# Patient Record
Sex: Female | Born: 1985 | Race: White | Hispanic: No | Marital: Married
Health system: Southern US, Community
[De-identification: ages and names within clinical notes are randomized; demographics above are authoritative.]

## PROBLEM LIST (undated history)

## (undated) DIAGNOSIS — Z8619 Personal history of other infectious and parasitic diseases: Secondary | ICD-10-CM

## (undated) DIAGNOSIS — Y991 Military activity: Secondary | ICD-10-CM

## (undated) DIAGNOSIS — F419 Anxiety disorder, unspecified: Secondary | ICD-10-CM

## (undated) DIAGNOSIS — E349 Endocrine disorder, unspecified: Secondary | ICD-10-CM

## (undated) DIAGNOSIS — R454 Irritability and anger: Secondary | ICD-10-CM

## (undated) DIAGNOSIS — M25519 Pain in unspecified shoulder: Secondary | ICD-10-CM

## (undated) DIAGNOSIS — R519 Headache, unspecified: Secondary | ICD-10-CM

## (undated) DIAGNOSIS — Z331 Pregnant state, incidental: Secondary | ICD-10-CM

## (undated) DIAGNOSIS — M940 Chondrocostal junction syndrome [Tietze]: Secondary | ICD-10-CM

## (undated) DIAGNOSIS — R0781 Pleurodynia: Secondary | ICD-10-CM

## (undated) DIAGNOSIS — F329 Major depressive disorder, single episode, unspecified: Secondary | ICD-10-CM

## (undated) DIAGNOSIS — R87629 Unspecified abnormal cytological findings in specimens from vagina: Secondary | ICD-10-CM

## (undated) DIAGNOSIS — F3181 Bipolar II disorder: Secondary | ICD-10-CM

## (undated) DIAGNOSIS — F4310 Post-traumatic stress disorder, unspecified: Secondary | ICD-10-CM

## (undated) DIAGNOSIS — R000 Tachycardia, unspecified: Secondary | ICD-10-CM

## (undated) DIAGNOSIS — G4700 Insomnia, unspecified: Secondary | ICD-10-CM

## (undated) DIAGNOSIS — K589 Irritable bowel syndrome without diarrhea: Secondary | ICD-10-CM

## (undated) DIAGNOSIS — R635 Abnormal weight gain: Secondary | ICD-10-CM

## (undated) DIAGNOSIS — L700 Acne vulgaris: Secondary | ICD-10-CM

## (undated) DIAGNOSIS — D225 Melanocytic nevi of trunk: Secondary | ICD-10-CM

## (undated) DIAGNOSIS — N930 Postcoital and contact bleeding: Secondary | ICD-10-CM

## (undated) DIAGNOSIS — R61 Generalized hyperhidrosis: Secondary | ICD-10-CM

## (undated) DIAGNOSIS — T07XXXA Unspecified multiple injuries, initial encounter: Secondary | ICD-10-CM

## (undated) DIAGNOSIS — R791 Abnormal coagulation profile: Secondary | ICD-10-CM

## (undated) DIAGNOSIS — E559 Vitamin D deficiency, unspecified: Secondary | ICD-10-CM

## (undated) DIAGNOSIS — R4586 Emotional lability: Secondary | ICD-10-CM

## (undated) DIAGNOSIS — J309 Allergic rhinitis, unspecified: Secondary | ICD-10-CM

## (undated) DIAGNOSIS — F1920 Other psychoactive substance dependence, uncomplicated: Secondary | ICD-10-CM

## (undated) DIAGNOSIS — F32A Depression, unspecified: Secondary | ICD-10-CM

## (undated) DIAGNOSIS — R002 Palpitations: Secondary | ICD-10-CM

## (undated) HISTORY — DX: Abnormal weight gain: R63.5

## (undated) HISTORY — DX: Personal history of other infectious and parasitic diseases: Z86.19

## (undated) HISTORY — DX: Unspecified multiple injuries, initial encounter: T07.XXXA

## (undated) HISTORY — DX: Postcoital and contact bleeding: N93.0

## (undated) HISTORY — DX: Irritability and anger: R45.4

## (undated) HISTORY — DX: Unspecified abnormal cytological findings in specimens from vagina: R87.629

## (undated) HISTORY — DX: Pleurodynia: R07.81

## (undated) HISTORY — DX: Tachycardia, unspecified: R00.0

## (undated) HISTORY — DX: Military activity: Y99.1

## (undated) HISTORY — DX: Irritable bowel syndrome, unspecified: K58.9

## (undated) HISTORY — DX: Depression, unspecified: F32.A

## (undated) HISTORY — DX: Vitamin D deficiency, unspecified: E55.9

## (undated) HISTORY — DX: Insomnia, unspecified: G47.00

## (undated) HISTORY — PX: DILATION AND CURETTAGE OF UTERUS: SHX78

## (undated) HISTORY — DX: Endocrine disorder, unspecified: E34.9

## (undated) HISTORY — DX: Emotional lability: R45.86

## (undated) HISTORY — DX: Other psychoactive substance dependence, uncomplicated: F19.20

## (undated) HISTORY — DX: Pain in unspecified shoulder: M25.519

## (undated) HISTORY — DX: Anxiety disorder, unspecified: F41.9

## (undated) HISTORY — DX: Major depressive disorder, single episode, unspecified: F32.9

## (undated) HISTORY — DX: Palpitations: R00.2

## (undated) HISTORY — DX: Chondrocostal junction syndrome (tietze): M94.0

## (undated) HISTORY — DX: Headache, unspecified: R51.9

## (undated) HISTORY — DX: Allergic rhinitis, unspecified: J30.9

## (undated) HISTORY — DX: Post-traumatic stress disorder, unspecified: F43.10

## (undated) HISTORY — DX: Abnormal coagulation profile: R79.1

## (undated) HISTORY — DX: Bipolar II disorder: F31.81

## (undated) HISTORY — DX: Acne vulgaris: L70.0

## (undated) HISTORY — PX: COLPOSCOPY: SHX161

## (undated) HISTORY — DX: Generalized hyperhidrosis: R61

## (undated) HISTORY — DX: Pregnant state, incidental: Z33.1

## (undated) HISTORY — DX: Melanocytic nevi of trunk: D22.5

---

## 2016-07-07 DIAGNOSIS — Z8619 Personal history of other infectious and parasitic diseases: Secondary | ICD-10-CM

## 2016-07-07 HISTORY — DX: Personal history of other infectious and parasitic diseases: Z86.19

## 2016-07-07 NOTE — L&D Delivery Note (Signed)
Delivery Note Pt progressed quickl yto complete dilation. At 10:06 PM a healthy female was delivered via  (Presentation: OA  ).  APGAR: 9, 9; weight pending .   Placenta status: delivered spontaneously .  Cord:  with the following complications: none .    Anesthesia: epidural   Episiotomy:  none Lacerations:  none Suture Repair:n/a Est. Blood Loss (mL):  175mL  Mom to postpartum.  Baby to Couplet care / Skin to Skin.  Lori Wright 02/06/2017, 10:25 PM

## 2016-07-09 LAB — OB RESULTS CONSOLE GC/CHLAMYDIA
Chlamydia: NEGATIVE
Gonorrhea: NEGATIVE

## 2016-07-09 LAB — OB RESULTS CONSOLE ABO/RH: RH TYPE: POSITIVE

## 2016-07-09 LAB — OB RESULTS CONSOLE RPR: RPR: NONREACTIVE

## 2016-07-09 LAB — OB RESULTS CONSOLE ANTIBODY SCREEN: ANTIBODY SCREEN: NEGATIVE

## 2016-07-09 LAB — OB RESULTS CONSOLE RUBELLA ANTIBODY, IGM: Rubella: NON-IMMUNE/NOT IMMUNE

## 2016-07-09 LAB — OB RESULTS CONSOLE HIV ANTIBODY (ROUTINE TESTING): HIV: NONREACTIVE

## 2016-07-09 LAB — OB RESULTS CONSOLE HEPATITIS B SURFACE ANTIGEN: Hepatitis B Surface Ag: NEGATIVE

## 2017-01-09 LAB — OB RESULTS CONSOLE GBS: STREP GROUP B AG: NEGATIVE

## 2017-01-26 ENCOUNTER — Encounter (HOSPITAL_COMMUNITY): Payer: Self-pay | Admitting: *Deleted

## 2017-01-26 ENCOUNTER — Telehealth (HOSPITAL_COMMUNITY): Payer: Self-pay | Admitting: *Deleted

## 2017-01-26 NOTE — Telephone Encounter (Signed)
Preadmission screen  

## 2017-01-27 ENCOUNTER — Encounter (HOSPITAL_COMMUNITY): Payer: Self-pay | Admitting: *Deleted
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
## 2017-02-06 ENCOUNTER — Inpatient Hospital Stay (HOSPITAL_COMMUNITY): Payer: Non-veteran care | Admitting: Anesthesiology

## 2017-02-06 ENCOUNTER — Encounter (HOSPITAL_COMMUNITY): Payer: Self-pay

## 2017-02-06 ENCOUNTER — Inpatient Hospital Stay (HOSPITAL_COMMUNITY)
Admission: RE | Admit: 2017-02-06 | Discharge: 2017-02-08 | DRG: 775 | Disposition: A | Discharge status: Home / Self Care | Payer: Non-veteran care | Source: Ambulatory Visit | Attending: Obstetrics and Gynecology | Admitting: Obstetrics and Gynecology

## 2017-02-06 DIAGNOSIS — Z3A40 40 weeks gestation of pregnancy: Secondary | ICD-10-CM

## 2017-02-06 DIAGNOSIS — O9962 Diseases of the digestive system complicating childbirth: Principal | ICD-10-CM | POA: Diagnosis present

## 2017-02-06 DIAGNOSIS — Z87891 Personal history of nicotine dependence: Secondary | ICD-10-CM

## 2017-02-06 DIAGNOSIS — O26893 Other specified pregnancy related conditions, third trimester: Secondary | ICD-10-CM | POA: Diagnosis present

## 2017-02-06 DIAGNOSIS — K219 Gastro-esophageal reflux disease without esophagitis: Secondary | ICD-10-CM | POA: Diagnosis present

## 2017-02-06 DIAGNOSIS — Z3490 Encounter for supervision of normal pregnancy, unspecified, unspecified trimester: Secondary | ICD-10-CM

## 2017-02-06 LAB — CBC
HCT: 37.4 % (ref 36.0–46.0)
Hemoglobin: 12.8 g/dL (ref 12.0–15.0)
MCH: 27.8 pg (ref 26.0–34.0)
MCHC: 34.2 g/dL (ref 30.0–36.0)
MCV: 81.3 fL (ref 78.0–100.0)
Platelets: 254 K/uL (ref 150–400)
RBC: 4.6 MIL/uL (ref 3.87–5.11)
RDW: 15.2 % (ref 11.5–15.5)
WBC: 11.3 K/uL — ABNORMAL HIGH (ref 4.0–10.5)

## 2017-02-06 LAB — TYPE AND SCREEN
ABO/RH(D): O POS
Antibody Screen: NEGATIVE

## 2017-02-06 LAB — ABO/RH: ABO/RH(D): O POS

## 2017-02-06 MED ORDER — PHENYLEPHRINE 40 MCG/ML (10ML) SYRINGE FOR IV PUSH (FOR BLOOD PRESSURE SUPPORT)
80.0000 ug | PREFILLED_SYRINGE | INTRAVENOUS | Status: DC | PRN
  Filled 2017-02-06: qty 5
  Filled 2017-02-06: qty 10

## 2017-02-06 MED ORDER — FENTANYL 2.5 MCG/ML BUPIVACAINE 1/10 % EPIDURAL INFUSION (WH - ANES)
14.0000 mL/h | INTRAMUSCULAR | Status: DC | PRN
  Administered 2017-02-06: 10 mL/h via EPIDURAL
  Filled 2017-02-06: qty 100

## 2017-02-06 MED ORDER — LACTATED RINGERS IV SOLN: 500.0000 mL | INTRAVENOUS | Status: DC | PRN

## 2017-02-06 MED ORDER — TERBUTALINE SULFATE 1 MG/ML IJ SOLN
0.2500 mg | Freq: Once | INTRAMUSCULAR | Status: DC | PRN
Start: 2017-02-06 — End: 2017-02-08
  Filled 2017-02-06: qty 1

## 2017-02-06 MED ORDER — DIPHENHYDRAMINE HCL 50 MG/ML IJ SOLN: 12.5000 mg | INTRAMUSCULAR | Status: DC | PRN

## 2017-02-06 MED ORDER — PHENYLEPHRINE 40 MCG/ML (10ML) SYRINGE FOR IV PUSH (FOR BLOOD PRESSURE SUPPORT): 80.0000 ug | PREFILLED_SYRINGE | INTRAVENOUS | Status: DC | PRN

## 2017-02-06 MED ORDER — ONDANSETRON HCL 4 MG/2ML IJ SOLN
4.0000 mg | Freq: Four times a day (QID) | INTRAMUSCULAR | Status: DC | PRN
  Administered 2017-02-06: 4 mg via INTRAVENOUS
  Filled 2017-02-06: qty 2

## 2017-02-06 MED ORDER — LIDOCAINE HCL (PF) 1 % IJ SOLN
INTRAMUSCULAR | Status: DC | PRN
  Administered 2017-02-06 (×2): 5 mL via EPIDURAL

## 2017-02-06 MED ORDER — OXYTOCIN 40 UNITS IN LACTATED RINGERS INFUSION - SIMPLE MED
1.0000 m[IU]/min | INTRAVENOUS | Status: DC
  Administered 2017-02-06: 2 m[IU]/min via INTRAVENOUS
  Filled 2017-02-06: qty 1000

## 2017-02-06 MED ORDER — OXYTOCIN BOLUS FROM INFUSION
500.0000 mL | Freq: Once | INTRAVENOUS | Status: AC
  Administered 2017-02-06: 500 mL via INTRAVENOUS

## 2017-02-06 MED ORDER — LACTATED RINGERS IV SOLN
INTRAVENOUS | Status: DC
  Administered 2017-02-06 (×2): via INTRAVENOUS

## 2017-02-06 MED ORDER — EPHEDRINE 5 MG/ML INJ
10.0000 mg | INTRAVENOUS | Status: DC | PRN
  Filled 2017-02-06: qty 2

## 2017-02-06 MED ORDER — EPHEDRINE 5 MG/ML INJ: 10.0000 mg | INTRAVENOUS | Status: DC | PRN

## 2017-02-06 MED ORDER — SOD CITRATE-CITRIC ACID 500-334 MG/5ML PO SOLN: 30.0000 mL | ORAL | Status: DC | PRN

## 2017-02-06 MED ORDER — LIDOCAINE HCL (PF) 1 % IJ SOLN
30.0000 mL | INTRAMUSCULAR | Status: DC | PRN
  Filled 2017-02-06: qty 30

## 2017-02-06 MED ORDER — PHENYLEPHRINE 40 MCG/ML (10ML) SYRINGE FOR IV PUSH (FOR BLOOD PRESSURE SUPPORT)
80.0000 ug | PREFILLED_SYRINGE | INTRAVENOUS | Status: DC | PRN
  Filled 2017-02-06: qty 5

## 2017-02-06 MED ORDER — LACTATED RINGERS IV SOLN
500.0000 mL | Freq: Once | INTRAVENOUS | Status: AC
  Administered 2017-02-06: 500 mL via INTRAVENOUS

## 2017-02-06 MED ORDER — OXYTOCIN 40 UNITS IN LACTATED RINGERS INFUSION - SIMPLE MED: 2.5000 [IU]/h | INTRAVENOUS | Status: DC

## 2017-02-06 MED ORDER — ACETAMINOPHEN 325 MG PO TABS: 650.0000 mg | ORAL_TABLET | ORAL | Status: DC | PRN

## 2017-02-06 MED ORDER — BUTORPHANOL TARTRATE 1 MG/ML IJ SOLN
1.0000 mg | INTRAMUSCULAR | Status: DC | PRN
Start: 2017-02-06 — End: 2017-02-08
  Administered 2017-02-06: 1 mg via INTRAVENOUS
  Filled 2017-02-06: qty 1

## 2017-02-06 MED ORDER — OXYCODONE-ACETAMINOPHEN 5-325 MG PO TABS: 2.0000 | ORAL_TABLET | ORAL | Status: DC | PRN

## 2017-02-06 MED ORDER — OXYCODONE-ACETAMINOPHEN 5-325 MG PO TABS
1.0000 | ORAL_TABLET | ORAL | Status: DC | PRN
Start: 2017-02-06 — End: 2017-02-08

## 2017-02-06 MED ORDER — LACTATED RINGERS IV SOLN
500.0000 mL | Freq: Once | INTRAVENOUS | Status: DC
Start: 2017-02-06 — End: 2017-02-06

## 2017-02-06 NOTE — Anesthesia Preprocedure Evaluation (Addendum)
Anesthesia Evaluation  Patient identified by MRN, date of birth, ID band Patient awake    Reviewed: Allergy & Precautions, NPO status , Patient's Chart, lab work & pertinent test results  History of Anesthesia Complications Negative for: history of anesthetic complications  Airway Mallampati: I  TM Distance: >3 FB Neck ROM: Full    Dental  (+) Dental Advisory Given   Pulmonary former smoker,    breath sounds clear to auscultation       Cardiovascular negative cardio ROS   Rhythm:Regular Rate:Normal     Neuro/Psych PSYCHIATRIC DISORDERS (PTSD)    GI/Hepatic Neg liver ROS, GERD  ,  Endo/Other  negative endocrine ROS  Renal/GU negative Renal ROS     Musculoskeletal   Abdominal   Peds  Hematology plt 254k   Anesthesia Other Findings   Reproductive/Obstetrics (+) Pregnancy                            Anesthesia Physical Anesthesia Plan  ASA: II  Anesthesia Plan: Epidural   Post-op Pain Management:    Induction:   PONV Risk Score and Plan: Treatment may vary due to age or medical condition  Airway Management Planned: Natural Airway  Additional Equipment:   Intra-op Plan:   Post-operative Plan:   Informed Consent: I have reviewed the patients History and Physical, chart, labs and discussed the procedure including the risks, benefits and alternatives for the proposed anesthesia with the patient or authorized representative who has indicated his/her understanding and acceptance.   Dental advisory given  Plan Discussed with:   Anesthesia Plan Comments: (Patient identified. Risks/Benefits/Options discussed with patient including but not limited to bleeding, infection, nerve damage, paralysis, failed block, incomplete pain control, headache, blood pressure changes, nausea, vomiting, reactions to medication both or allergic, itching and postpartum back pain. Confirmed with bedside nurse  the patient's most recent platelet count. Confirmed with patient that they are not currently taking any anticoagulation, have any bleeding history or any family history of bleeding disorders. Patient expressed understanding and wished to proceed. All questions were answered. )       Anesthesia Quick Evaluation

## 2017-02-06 NOTE — Anesthesia Procedure Notes (Addendum)
Epidural Patient location during procedure: OB Start time: 02/06/2017 7:00 PM End time: 02/06/2017 7:27 PM  Staffing Anesthesiologist: Jairo BenJACKSON, Herndon Grill Performed: anesthesiologist   Preanesthetic Checklist Completed: patient identified, surgical consent, pre-op evaluation, timeout performed, IV checked, risks and benefits discussed and monitors and equipment checked  Epidural Patient position: sitting Prep: site prepped and draped and DuraPrep Patient monitoring: blood pressure, continuous pulse ox and heart rate Approach: midline Location: L2-L3 Injection technique: LOR air  Needle:  Needle type: Tuohy  Needle gauge: 17 G Needle length: 9 cm Needle insertion depth: 3.5 cm Catheter type: closed end flexible Catheter at skin depth: 9.5 cm Test dose: negative (1% lidocaine)  Assessment Events: blood not aspirated, injection not painful, no injection resistance, negative IV test and no paresthesia  Additional Notes Pt identified in Labor room.  Monitors applied. Working IV access confirmed. Sterile prep, drape lumbar spine.  1% lido local L 2,3.  #17ga Touhy LOR air at 3.5 cm L 2,3, cath in easily to 9.5 cm skin. Test dose OK, cath dosed and infusion begun.  Patient asymptomatic, VSS, no heme aspirated, tolerated well.  Sandford Craze Starlyn Droge, MDReason for block:procedure for pain

## 2017-02-06 NOTE — Progress Notes (Signed)
Patient ID: Lori Wright, female   DOB: 1985-12-11, 31 y.o.   MRN: [id number] Pt comfortable with epidural afeb vss  FHR category 1  Cervix 6/80/-1  IUPC placed to adjust pitocin Making progress

## 2017-02-06 NOTE — Anesthesia Pain Management Evaluation Note (Signed)
  CRNA Pain Management Visit Note  Patient: Lori Wright, 31 y.o., female  "Hello I am a [facility] of the anesthesia [facility]. We have an anesthesia team available at all times to provide care throughout the hospital, including epidural management and anesthesia for C-section. I don't know your plan for the delivery whether it a natural birth, water birth, IV sedation, nitrous supplementation, doula or epidural, but we want to meet your pain goals."   1.Was your pain managed to your expectations on prior hospitalizations?   Yes   2.What is your expectation for pain management during this hospitalization?     Epidural and IV pain meds  3.How can we help you reach that goal? IV pain meds. Epidural if needed. Patient is aware of all pain control options.  Record the patient's initial score and the patient's pain goal.   Pain: 0  Pain Goal: 10 [facility] wants you to be able to say your pain was always managed very well.  Keitra Carusone L 02/06/2017

## 2017-02-07 ENCOUNTER — Inpatient Hospital Stay (HOSPITAL_COMMUNITY): Admission: AD | Admit: 2017-02-07 | Payer: Self-pay | Source: Ambulatory Visit | Admitting: Obstetrics and Gynecology

## 2017-02-07 ENCOUNTER — Encounter (HOSPITAL_COMMUNITY): Payer: Self-pay

## 2017-02-07 LAB — CBC
HCT: 32.8 % — ABNORMAL LOW (ref 36.0–46.0)
Hemoglobin: 11.1 g/dL — ABNORMAL LOW (ref 12.0–15.0)
MCH: 27.8 pg (ref 26.0–34.0)
MCHC: 33.8 g/dL (ref 30.0–36.0)
MCV: 82.2 fL (ref 78.0–100.0)
Platelets: 182 K/uL (ref 150–400)
RBC: 3.99 MIL/uL (ref 3.87–5.11)
RDW: 15.4 % (ref 11.5–15.5)
WBC: 14.6 K/uL — ABNORMAL HIGH (ref 4.0–10.5)

## 2017-02-07 LAB — RPR: RPR Ser Ql: NONREACTIVE

## 2017-02-07 MED ORDER — IBUPROFEN 600 MG PO TABS
600.0000 mg | ORAL_TABLET | Freq: Four times a day (QID) | ORAL | Status: DC
  Administered 2017-02-07 – 2017-02-08 (×4): 600 mg via ORAL
  Filled 2017-02-07 (×4): qty 1

## 2017-02-07 MED ORDER — WITCH HAZEL-GLYCERIN EX PADS: 1.0000 "application " | MEDICATED_PAD | CUTANEOUS | Status: DC | PRN

## 2017-02-07 MED ORDER — SENNOSIDES-DOCUSATE SODIUM 8.6-50 MG PO TABS: 2.0000 | ORAL_TABLET | ORAL | Status: DC

## 2017-02-07 MED ORDER — OXYCODONE HCL 5 MG PO TABS: 5.0000 mg | ORAL_TABLET | ORAL | Status: DC | PRN

## 2017-02-07 MED ORDER — ZOLPIDEM TARTRATE 5 MG PO TABS: 5.0000 mg | ORAL_TABLET | Freq: Every evening | ORAL | Status: DC | PRN

## 2017-02-07 MED ORDER — TETANUS-DIPHTH-ACELL PERTUSSIS 5-2.5-18.5 LF-MCG/0.5 IM SUSP: 0.5000 mL | Freq: Once | INTRAMUSCULAR | Status: DC

## 2017-02-07 MED ORDER — DIBUCAINE 1 % RE OINT: 1.0000 "application " | TOPICAL_OINTMENT | RECTAL | Status: DC | PRN

## 2017-02-07 MED ORDER — BENZOCAINE-MENTHOL 20-0.5 % EX AERO: 1.0000 "application " | INHALATION_SPRAY | CUTANEOUS | Status: DC | PRN

## 2017-02-07 MED ORDER — ONDANSETRON HCL 4 MG/2ML IJ SOLN: 4.0000 mg | INTRAMUSCULAR | Status: DC | PRN

## 2017-02-07 MED ORDER — DIPHENHYDRAMINE HCL 25 MG PO CAPS: 25.0000 mg | ORAL_CAPSULE | Freq: Four times a day (QID) | ORAL | Status: DC | PRN

## 2017-02-07 MED ORDER — COCONUT OIL OIL: 1.0000 "application " | TOPICAL_OIL | Status: DC | PRN

## 2017-02-07 MED ORDER — OXYCODONE HCL 5 MG PO TABS: 10.0000 mg | ORAL_TABLET | ORAL | Status: DC | PRN

## 2017-02-07 MED ORDER — ACETAMINOPHEN 325 MG PO TABS: 650.0000 mg | ORAL_TABLET | ORAL | Status: DC | PRN

## 2017-02-07 MED ORDER — SIMETHICONE 80 MG PO CHEW: 80.0000 mg | CHEWABLE_TABLET | ORAL | Status: DC | PRN

## 2017-02-07 MED ORDER — ONDANSETRON HCL 4 MG PO TABS: 4.0000 mg | ORAL_TABLET | ORAL | Status: DC | PRN

## 2017-02-07 MED ORDER — PRENATAL MULTIVITAMIN CH
1.0000 | ORAL_TABLET | Freq: Every day | ORAL | Status: DC
  Administered 2017-02-07: 1 via ORAL
  Filled 2017-02-07: qty 1

## 2017-02-07 NOTE — Anesthesia Postprocedure Evaluation (Signed)
Anesthesia Post Note  Patient: Lori Wright  Procedure(s) Performed: * No procedures listed *     Patient location during evaluation: Mother Baby Anesthesia Type: Epidural Level of consciousness: awake and alert and oriented Pain management: satisfactory to patient Vital Signs Assessment: post-procedure vital signs reviewed and stable Respiratory status: spontaneous breathing and nonlabored ventilation Cardiovascular status: stable Postop Assessment: no headache, no backache, no signs of nausea or vomiting, adequate PO intake and patient able to bend at knees (patient up walking) Anesthetic complications: no    Last Vitals:  Vitals:   02/07/17 0135 02/07/17 0535  BP: 117/65 (!) 100/57  Pulse: 67 (!) 57  Resp: 18 18  Temp: 36.7 C 36.6 C    Last Pain:  Vitals:   02/07/17 0649  TempSrc:   PainSc: 0-No pain   Pain Goal:                 Madison HickmanGREGORY,Maevyn Riordan

## 2017-02-07 NOTE — Lactation Note (Signed)
This note was copied from a baby's chart. Lactation Consultation Note; Mom attempting to latch baby as I went into room. Assisted with getting deeper latch. Mom reports no pain with sucks, only tugging. Reviewed BF basics.  Dr Eddie Candleummings in to check baby. Spit small amount of mucous. Ember latched to other breast after a few attempts, then getting sleepy.Encouraged skin to skin as much as possible today. Reviewed norman newborn behavior the first 24 hours and cluster feeding, and encouraged to take nap this afternoon. BF brochure given with reviewed of our phone number to call with questions/concerns. No further questions at present. Patient Name: Lori Wright Today's Date: 02/07/2017 Reason for consult: Initial assessment   Maternal Data Formula Feeding for Exclusion: No Has patient been taught Hand Expression?: Yes Does the patient have breastfeeding experience prior to this delivery?: No  Feeding Feeding Type: Breast Fed Length of feed: 10 min  LATCH Score Latch: Repeated attempts needed to sustain latch, nipple held in mouth throughout feeding, stimulation needed to elicit sucking reflex.  Audible Swallowing: A few with stimulation  Type of Nipple: Everted at rest and after stimulation  Comfort (Breast/Nipple): Soft / non-tender  Hold (Positioning): Assistance needed to correctly position infant at breast and maintain latch.  LATCH Score: 7  Interventions Interventions: Breast feeding basics reviewed;Assisted with latch;Skin to skin;Hand express  Lactation Tools Discussed/Used     Consult Status Consult Status: Follow-up Date: 02/08/17 Follow-up type: In-patient    Pamelia HoitWeeks, Demetries Coia D 02/07/2017, 9:36 AM

## 2017-02-07 NOTE — Progress Notes (Signed)
CSW acknowledges consult for MOB's hx of PTSD. This writer met with MOB at bedside to assess needs. Upon this writers arrival, MOB was breast feeding baby with significant other/FOB present. With MOB's permission, this writer explained role and reasoning for visit. MOB was warm and welcoming. CSW inquired about PTSD hx. 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 MOB notes she receives care at the [facility] where she has a psychiatrist and has an appointment scheduled for next week. CSW discussed PPD with MOB and baby blues. CSW encouraged MOB to seek medical attention in the event she ever feels symptoms of depression on set and are affecting her care for herself or baby. MOB verbalized understanding. MOB denying SI/HI at this time and reports good mood. FOB was noted to be very supportive of MOB while CSW was present in the room. MOB and FOB both note no further needs at this time. CSW has no barriers to d/c.   Darey Hershberger, MSW, LCSW-A Clinical Social Worker  [facility]  Office: [phone]   

## 2017-02-07 NOTE — Progress Notes (Signed)
Post Partum Day 1 Subjective: no complaints, up ad lib and tolerating PO  Objective: Blood pressure (!) 100/57, pulse (!) 57, temperature 97.9 F (36.6 C), temperature source Oral, resp. rate 18, height 5' (1.524 m), weight 63.9 kg (140 lb 14.4 oz), last menstrual period 05/02/2016, SpO2 98 %, unknown if currently breastfeeding.  Physical Exam:  General: alert and cooperative Lochia: appropriate Uterine Fundus: firm   Recent Labs  02/06/17 1446 02/07/17 0549  HGB 12.8 11.1*  HCT 37.4 32.8*    Assessment/Plan: Plan for discharge tomorrow   LOS: 1 day   Oliver PilaKathy W Karelyn Brisby 02/07/2017, 10:59 AM

## 2017-02-08 DIAGNOSIS — O9962 Diseases of the digestive system complicating childbirth: Secondary | ICD-10-CM | POA: Diagnosis not present

## 2017-02-08 MED ORDER — MEASLES, MUMPS & RUBELLA VAC ~~LOC~~ INJ
0.5000 mL | INJECTION | Freq: Once | SUBCUTANEOUS | Status: AC
  Administered 2017-02-08: 0.5 mL via SUBCUTANEOUS
  Filled 2017-02-08: qty 0.5

## 2017-02-08 MED ORDER — IBUPROFEN 600 MG PO TABS: 600.0000 mg | ORAL_TABLET | Freq: Four times a day (QID) | ORAL | 0 refills | Status: DC

## 2017-02-08 NOTE — Lactation Note (Signed)
This note was copied from a baby's chart. Lactation Consultation Note  Patient Name: Girl [place] [name] WUJWJ'XToday's Date: 02/08/2017 Reason for consult: Follow-up assessment   With this mom of a term baby, now 3336 hours old, and extremely sleepy when at the breast. The baby will be alert, root when away form mom, but not feed at the breast  On exam of her mouth, Verdis Fredericksonmber has a tight upper lip frenulum, causing blanching with flanging, and a posterior , short, thick frenulum, with restricted tongue elevation and extension. Mom had been fitted with a 16 nipple shield, but I increased her to a 20 shield, with a good fit. Ember did take the 4 mls placed in the shield, but that was the only sucking and swallowing she did.  I advised mom to use the nipple shield with each breast feeding, and limit the time at breast to when Verdis Fredericksonmber is awake and interested. Then dad can bottle feed Ember while mom pumps, at least every 3 hours for each. Mom's milk is transitioning in early, and parents know to feed EBM prior to formula. I advised parents to simplify this plan by using a bottle to feed her, if they want to, as opposed to finger feeding with a syringe.  I gave mom the oral resource sheets, and the [facility], to call for and o/p lactation consult.    Maternal Data    Feeding Feeding Type: Breast Milk Nipple Type: Slow - flow  LATCH Score Latch: Repeated attempts needed to sustain latch, nipple held in mouth throughout feeding, stimulation needed to elicit sucking reflex.  Audible Swallowing: A few with stimulation (baby drank the 4 ml's fed into nipploe shield with curved tip syringe, she was very sleepy)  Type of Nipple: Everted at rest and after stimulation  Comfort (Breast/Nipple): Soft / non-tender  Hold (Positioning): Assistance needed to correctly position infant at breast and maintain latch.  LATCH Score: 7  Interventions    Lactation Tools Discussed/Used Nipple shield size:  20 Breast pump type: Double-Electric Breast Pump   Consult Status Consult Status: Complete Follow-up type: Call as needed    Alfred LevinsLee, Kattie Santoyo Anne 02/08/2017, 10:34 AM

## 2017-02-08 NOTE — Progress Notes (Signed)
Post Partum Day 1 Subjective: no complaints and tolerating PO  Objective: Blood pressure 113/69, pulse (!) 57, temperature 98 F (36.7 C), temperature source Oral, resp. rate 20, height 5' (1.524 m), weight 63.9 kg (140 lb 14.4 oz), last menstrual period 05/02/2016, SpO2 99 %, unknown if currently breastfeeding.  Physical Exam:  General: alert and cooperative Lochia: appropriate Uterine Fundus: firm    Recent Labs  02/06/17 1446 02/07/17 0549  HGB 12.8 11.1*  HCT 37.4 32.8*    Assessment/Plan: Discharge home   LOS: 2 days   Oliver PilaKathy W Maciej Schweitzer 02/08/2017, 9:28 AM

## 2017-02-08 NOTE — Discharge Summary (Signed)
OB Discharge Summary     Patient Name: Lori Wright DOB: 1986-02-27 MRN: [id number]  Date of admission: 02/06/2017 Delivering MD: Huel CoteICHARDSON, Jacksen Isip   Date of discharge: 02/08/2017  Admitting diagnosis: INDUCTION Intrauterine pregnancy: 2336w0d     Secondary diagnosis:  Active Problems:   Term pregnancy   NSVD (normal spontaneous vaginal delivery)  Additional problems: none     Discharge diagnosis: Term Pregnancy Delivered                                                                                                Post partum procedures:none  Augmentation: AROM and Pitocin  Complications: None  Hospital course:  Induction of Labor With Vaginal Delivery   31 y.o. yo [id number] at 6336w0d was admitted to the hospital 02/06/2017 for induction of labor.  Indication for induction: Favorable cervix at term.  Patient had an uncomplicated labor course as follows: Membrane Rupture Time/Date: 5:02 PM ,02/06/2017   Intrapartum Procedures: Episiotomy: None [1]                                         Lacerations:  None [1]  Patient had delivery of a Viable infant.  Information for the patient's newborn:  Wright, Girl Lori [[id number]][[id number]]  Delivery Method: Vaginal, Spontaneous Delivery (Filed from Delivery Summary)   02/06/2017  Details of delivery can be found in separate delivery note.  Patient had a routine postpartum course. Patient is discharged home 02/08/17.  Physical exam  Vitals:   02/07/17 0535 02/07/17 1150 02/07/17 1853 02/08/17 0631  BP: (!) 100/57 (!) 114/50 121/71 113/69  Pulse: (!) 57 (!) 57 61 (!) 57  Resp: 18 18 18 20   Temp: 97.9 F (36.6 C) 97.7 F (36.5 C) 97.8 F (36.6 C) 98 F (36.7 C)  TempSrc: Oral Oral Oral Oral  SpO2:  99%    Weight:      Height:       General: alert and cooperative Lochia: appropriate Uterine Fundus: firm   Labs: Lab Results  Component Value Date   WBC 14.6 (H) 02/07/2017   HGB 11.1 (L) 02/07/2017   HCT 32.8 (L) 02/07/2017   MCV 82.2  02/07/2017   PLT 182 02/07/2017   No flowsheet data found.  Discharge instruction: per After Visit Summary and "Baby and Me Booklet".  After visit meds:  Allergies as of 02/08/2017   No Known Allergies     Medication List    STOP taking these medications   calcium carbonate 500 MG chewable tablet Commonly known as:  TUMS - dosed in mg elemental calcium     TAKE these medications   DHA PO Take 2 tablets by mouth daily.   ibuprofen 600 MG tablet Commonly known as:  ADVIL,MOTRIN Take 1 tablet (600 mg total) by mouth every 6 (six) hours.   prenatal multivitamin Tabs tablet Take 3 tablets by mouth daily at 12 noon.       Diet: routine diet  Activity: Advance as tolerated. Pelvic rest  for 6 weeks.   Outpatient follow up:6 weeks Follow up Appt:No future appointments. Follow up Visit:No Follow-up on file.  Postpartum contraception: Natural Family Planning  Newborn Data: Live born female  Birth Weight: 7 lb 6.5 oz (3359 g) APGAR: 9, 9  Baby Feeding: Breast Disposition:home with mother   02/08/2017 Oliver PilaKathy W Margree Gimbel, MD

## 2017-02-08 NOTE — Lactation Note (Signed)
This note was copied from a baby's chart. Lactation Consultation Note New mom having trouble getting baby to latch. Baby very sleepy.having difficulty stimulating, baby would cry, give to mom wouldn't suckle on breast. Wants to sleep. Finally got baby latched to breast w/sand which hold. Baby would suckle for a short time then sleep. Only way to waken was to remove from breast and get baby to cry again. Finally got baby to open eyes, placed baby back with mom. Fitted mom w/#16 NS. Mom has semi flat/short shaft nipples. Areola and nipple compressible. Baby unable or wont maintain latch. Shells given to mom to wear to evert nipples more.  Mom had pumped w/hand pump 8ml colostrum. Suck training w/gloved finger to stimulate baby to suck. Baby cry not wanting to suck. With much attempts baby suckled on finger, syring fed 8ml colostrum. Latched to breast w/colostrum in NS. Baby cried, not wanting to latch. Finally started suckling. Multiple attempts to get baby to cont. BF. Discuss cluster feeding. Baby had a lot of stools. Encouraged mom to cont. To pump and supplement to stimulate to BF or post feed.   Baby has slight recessed chin. Mom denied painful latch. Demonstrated chin tug.  Stressed importance of calling Rn if baby cont. To have no interest in BF or can't wake for feedings. Strict I&O encouraged. Report to RN.  Patient Name: Lori Wright Reason for consult: Follow-up assessment   Maternal Data    Feeding Feeding Type: Breast Milk Length of feed: 20 min  LATCH Score Latch: Repeated attempts needed to sustain latch, nipple held in mouth throughout feeding, stimulation needed to elicit sucking reflex.  Audible Swallowing: Spontaneous and intermittent  Type of Nipple: Everted at rest and after stimulation (short shaft)  Comfort (Breast/Nipple): Soft / non-tender  Hold (Positioning): Full assist, staff holds infant at breast  LATCH Score:  7  Interventions Interventions: Breast feeding basics reviewed;Support pillows;Assisted with latch;Position options;Skin to skin;Expressed milk;Breast massage;Hand express;Shells;Breast compression;Adjust position;Hand pump  Lactation Tools Discussed/Used Tools: Shells;Pump;Nipple Shields Nipple shield size: 16 Shell Type: Inverted Breast pump type: Manual WIC Program: No Pump Review: Setup, frequency, and cleaning;Milk Storage Initiated by:: RN Date initiated:: 02/07/17   Consult Status Consult Status: Follow-up Date: 02/08/17 Follow-up type: In-patient    Lori Wright, Diamond NickelLAURA G Wright, 3:09 AM

## 2018-06-16 LAB — LAB REPORT - SCANNED: PREG TEST UR: POSITIVE

## 2018-07-07 NOTE — L&D Delivery Note (Signed)
Patient: Lori Wright MRN: [id number]  GBS status: Negative, IAP given: None  Patient is a 33 y.o. now G6P3 s/p NSVD at [redacted]w[redacted]d, who was admitted for SOL. SROM 0h 25m prior to delivery with clear fluid.    Delivery Note At 12:50 AM a viable female was delivered via Vaginal, Spontaneous (Presentation: OA ).  APGAR: 9, 9; weight pending.   Placenta status: spontaneous, intact.  Cord: 3 vessel with the following complications: none.   Anesthesia:  None  Episiotomy: None Lacerations: None Suture Repair: N/A Est. Blood Loss (mL): 31  Mother standing, leaning over bed for delivery. Head delivered OA with compound hand noted. No nuchal cord present. Shoulder and body delivered in usual fashion. Infant with spontaneous cry, held by provider and RN, dried and bulb suctioned. Cord clamped x 2 after 1-minute delay, and cut by family member. At this time, noted to have large amount of blood with clots on the floor and trickle of bright red blood. Due to inability to quantify blood loss immediately, persistent bleeding, and patient positioning making fundal rub difficult, Methergine 0.2 mg given in addition to Pitocin bolus. Cord blood drawn and mother assisted to the bed. Placenta delivered spontaneously with gentle cord traction. Fundus firm with massage and pharmacologic interventions. Perineum inspected and found to have no lacerations.  Mom to postpartum.  Baby to Couplet care / Skin to Skin.  Melina Schools 02/21/2019, 1:18 AM

## 2018-07-13 ENCOUNTER — Encounter: Payer: Self-pay | Admitting: *Deleted

## 2018-07-20 ENCOUNTER — Ambulatory Visit: Payer: Self-pay

## 2018-07-20 ENCOUNTER — Ambulatory Visit (INDEPENDENT_AMBULATORY_CARE_PROVIDER_SITE_OTHER): Payer: Non-veteran care | Admitting: Student

## 2018-07-20 ENCOUNTER — Encounter: Payer: Self-pay | Admitting: Student

## 2018-07-20 ENCOUNTER — Ambulatory Visit: Payer: Non-veteran care | Admitting: Clinical

## 2018-07-20 VITALS — BP 127/78 | HR 76 | Wt 132.8 lb

## 2018-07-20 DIAGNOSIS — Z3490 Encounter for supervision of normal pregnancy, unspecified, unspecified trimester: Secondary | ICD-10-CM

## 2018-07-20 DIAGNOSIS — Z3491 Encounter for supervision of normal pregnancy, unspecified, first trimester: Secondary | ICD-10-CM

## 2018-07-20 DIAGNOSIS — O3680X0 Pregnancy with inconclusive fetal viability, not applicable or unspecified: Secondary | ICD-10-CM | POA: Diagnosis not present

## 2018-07-20 DIAGNOSIS — Z23 Encounter for immunization: Secondary | ICD-10-CM

## 2018-07-20 NOTE — BH Specialist Note (Signed)
Integrated Behavioral Health Initial Visit  MRN: [id number] Name: Lori Wright  Number of Integrated Behavioral Health Clinician visits:: 1/6 Session Start time: 9:56  Session End time: 10:05 Total time: 15 minutes  Type of Service: Integrated Behavioral Health- Individual/Family Interpretor:No. Interpretor Name and Language: n/a   Warm Hand Off Completed.       SUBJECTIVE: Lori Wright is a 33 y.o. female accompanied by n/a Patient was referred by Judeth Horn, NP for Initial OB introduction to integrated behavioral health services. Patient reports the following symptoms/concerns: Pt states no particular concern today. Duration of problem: n/a; Severity of problem: n/a  OBJECTIVE: Mood: Normal and Affect: Appropriate Risk of harm to self or others: No plan to harm self or others  LIFE CONTEXT: Family and Social: Pt lives with husband and 2yo daughter School/Work: Pt works full-time Self-Care: - Life Changes: Current pregnancy   GOALS ADDRESSED: Patient will: 1. Increase knowledge and/or ability of: healthy habits   INTERVENTIONS: Interventions utilized: Psychoeducation and/or Health Education  Standardized Assessments completed: GAD-7 and PHQ 9  ASSESSMENT: Patient currently experiencing Supervision of low risk pregnancy, antepartum.   Patient may benefit from Initial OB introduction to integrated behavioral health services.  PLAN: 1. Follow up with behavioral health clinician on : As needed 2. Behavioral recommendations:  -Continue taking prenatal vitamin, as recommended by medical provider 3. Referral(s): Integrated [employer] (In Clinic) 4. "From scale of 1-10, how likely are you to follow plan?": 10  Rae Lips, LCSW  Depression screen [facility] 2/9 07/20/2018  Decreased Interest 1  Down, Depressed, Hopeless 0  PHQ - 2 Score 1  Altered sleeping 0  Tired, decreased energy 2  Change in appetite 0  Feeling bad or failure about  yourself  0  Trouble concentrating 0  Moving slowly or fidgety/restless 0  Suicidal thoughts 0  PHQ-9 Score 3   GAD 7 : Generalized Anxiety Score 07/20/2018  Nervous, Anxious, on Edge 1  Control/stop worrying 0  Worry too much - different things 0  Trouble relaxing 0  Restless 0  Easily annoyed or irritable 1  Afraid - awful might happen 0  Total GAD 7 Score 2
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
## 2018-07-20 NOTE — Progress Notes (Signed)
Pt informed that the ultrasound is considered a limited OB ultrasound and is not intended to be a complete ultrasound exam.  Patient also informed that the ultrasound is not being completed with the intent of assessing for fetal or placental anomalies or any pelvic abnormalities.  Explained that the purpose of today's ultrasound is to assess for viability, dates.  Patient acknowledges the purpose of the exam and the limitations of the study.

## 2018-07-20 NOTE — Patient Instructions (Signed)
First Trimester of Pregnancy The first trimester of pregnancy is from week 1 until the end of week 13 (months 1 through 3). A week after a sperm fertilizes an egg, the egg will implant on the wall of the uterus. This embryo will begin to develop into a baby. Genes from you and your partner will form the baby. The female genes will determine whether the baby will be a boy or a girl. At 6-8 weeks, the eyes and face will be formed, and the heartbeat can be seen on ultrasound. At the end of 12 weeks, all the baby's organs will be formed. Now that you are pregnant, you will want to do everything you can to have a healthy baby. Two of the most important things are to get good prenatal care and to follow your health care provider's instructions. Prenatal care is all the medical care you receive before the baby's birth. This care will help prevent, find, and treat any problems during the pregnancy and childbirth. Body changes during your first trimester Your body goes through many changes during pregnancy. The changes vary from woman to woman.  You may gain or lose a couple of pounds at first.  You may feel sick to your stomach (nauseous) and you may throw up (vomit). If the vomiting is uncontrollable, call your health care provider.  You may tire easily.  You may develop headaches that can be relieved by medicines. All medicines should be approved by your health care provider.  You may urinate more often. Painful urination may mean you have a bladder infection.  You may develop heartburn as a result of your pregnancy.  You may develop constipation because certain hormones are causing the muscles that push stool through your intestines to slow down.  You may develop hemorrhoids or swollen veins (varicose veins).  Your breasts may begin to grow larger and become tender. Your nipples may stick out more, and the tissue that surrounds them (areola) may become darker.  Your gums may bleed and may be  sensitive to brushing and flossing.  Dark spots or blotches (chloasma, mask of pregnancy) may develop on your face. This will likely fade after the baby is born.  Your menstrual periods will stop.  You may have a loss of appetite.  You may develop cravings for certain kinds of food.  You may have changes in your emotions from day to day, such as being excited to be pregnant or being concerned that something may go wrong with the pregnancy and baby.  You may have more vivid and strange dreams.  You may have changes in your hair. These can include thickening of your hair, rapid growth, and changes in texture. Some women also have hair loss during or after pregnancy, or hair that feels dry or thin. Your hair will most likely return to normal after your baby is born. What to expect at prenatal visits During a routine prenatal visit:  You will be weighed to make sure you and the baby are growing normally.  Your blood pressure will be taken.  Your abdomen will be measured to track your baby's growth.  The fetal heartbeat will be listened to between weeks 10 and 14 of your pregnancy.  Test results from any previous visits will be discussed. Your health care provider may ask you:  How you are feeling.  If you are feeling the baby move.  If you have had any abnormal symptoms, such as leaking fluid, bleeding, severe headaches, or abdominal  cramping.  If you are using any tobacco products, including cigarettes, chewing tobacco, and electronic cigarettes.  If you have any questions. Other tests that may be performed during your first trimester include:  Blood tests to find your blood type and to check for the presence of any previous infections. The tests will also be used to check for low iron levels (anemia) and protein on red blood cells (Rh antibodies). Depending on your risk factors, or if you previously had diabetes during pregnancy, you may have tests to check for high blood sugar  that affects pregnant women (gestational diabetes).  Urine tests to check for infections, diabetes, or protein in the urine.  An ultrasound to confirm the proper growth and development of the baby.  Fetal screens for spinal cord problems (spina bifida) and Down syndrome.  HIV (human immunodeficiency virus) testing. Routine prenatal testing includes screening for HIV, unless you choose not to have this test.  You may need other tests to make sure you and the baby are doing well. Follow these instructions at home: Medicines  Follow your health care provider's instructions regarding medicine use. Specific medicines may be either safe or unsafe to take during pregnancy.  Take a prenatal vitamin that contains at least 600 micrograms (mcg) of folic acid.  If you develop constipation, try taking a stool softener if your health care provider approves. Eating and drinking   Eat a balanced diet that includes fresh fruits and vegetables, whole grains, good sources of protein such as meat, eggs, or tofu, and low-fat dairy. Your health care provider will help you determine the amount of weight gain that is right for you.  Avoid raw meat and uncooked cheese. These carry germs that can cause birth defects in the baby.  Eating four or five small meals rather than three large meals a day may help relieve nausea and vomiting. If you start to feel nauseous, eating a few soda crackers can be helpful. Drinking liquids between meals, instead of during meals, also seems to help ease nausea and vomiting.  Limit foods that are high in fat and processed sugars, such as fried and sweet foods.  To prevent constipation: ? Eat foods that are high in fiber, such as fresh fruits and vegetables, whole grains, and beans. ? Drink enough fluid to keep your urine clear or pale yellow. Activity  Exercise only as directed by your health care provider. Most women can continue their usual exercise routine during  pregnancy. Try to exercise for 30 minutes at least 5 days a week. Exercising will help you: ? Control your weight. ? Stay in shape. ? Be prepared for labor and delivery.  Experiencing pain or cramping in the lower abdomen or lower back is a good sign that you should stop exercising. Check with your health care provider before continuing with normal exercises.  Try to avoid standing for long periods of time. Move your legs often if you must stand in one place for a long time.  Avoid heavy lifting.  Wear low-heeled shoes and practice good posture.  You may continue to have sex unless your health care provider tells you not to. Relieving pain and discomfort  Wear a good support bra to relieve breast tenderness.  Take warm sitz baths to soothe any pain or discomfort caused by hemorrhoids. Use hemorrhoid cream if your health care provider approves.  Rest with your legs elevated if you have leg cramps or low back pain.  If you develop varicose veins in  your legs, wear support hose. Elevate your feet for 15 minutes, 3-4 times a day. Limit salt in your diet. Prenatal care  Schedule your prenatal visits by the twelfth week of pregnancy. They are usually scheduled monthly at first, then more often in the last 2 months before delivery.  Write down your questions. Take them to your prenatal visits.  Keep all your prenatal visits as told by your health care provider. This is important. Safety  Wear your seat belt at all times when driving.  Make a list of emergency phone numbers, including numbers for family, friends, the hospital, and police and fire departments. General instructions  Ask your health care provider for a referral to a local prenatal education class. Begin classes no later than the beginning of month 6 of your pregnancy.  Ask for help if you have counseling or nutritional needs during pregnancy. Your health care provider can offer advice or refer you to specialists for help  with various needs.  Do not use hot tubs, steam rooms, or saunas.  Do not douche or use tampons or scented sanitary pads.  Do not cross your legs for long periods of time.  Avoid cat litter boxes and soil used by cats. These carry germs that can cause birth defects in the baby and possibly loss of the fetus by miscarriage or stillbirth.  Avoid all smoking, herbs, alcohol, and medicines not prescribed by your health care provider. Chemicals in these products affect the formation and growth of the baby.  Do not use any products that contain nicotine or tobacco, such as cigarettes and e-cigarettes. If you need help quitting, ask your health care provider. You may receive counseling support and other resources to help you quit.  Schedule a dentist appointment. At home, brush your teeth with a soft toothbrush and be gentle when you floss. Contact a health care provider if:  You have dizziness.  You have mild pelvic cramps, pelvic pressure, or nagging pain in the abdominal area.  You have persistent nausea, vomiting, or diarrhea.  You have a bad smelling vaginal discharge.  You have pain when you urinate.  You notice increased swelling in your face, hands, legs, or ankles.  You are exposed to fifth disease or chickenpox.  You are exposed to [place] measles (rubella) and have never had it. Get help right away if:  You have a fever.  You are leaking fluid from your vagina.  You have spotting or bleeding from your vagina.  You have severe abdominal cramping or pain.  You have rapid weight gain or loss.  You vomit blood or material that looks like coffee grounds.  You develop a severe headache.  You have shortness of breath.  You have any kind of trauma, such as from a fall or a car accident. Summary  The first trimester of pregnancy is from week 1 until the end of week 13 (months 1 through 3).  Your body goes through many changes during pregnancy. The changes vary from  woman to woman.  You will have routine prenatal visits. During those visits, your health care provider will examine you, discuss any test results you may have, and talk with you about how you are feeling. This information is not intended to replace advice given to you by your health care provider. Make sure you discuss any questions you have with your health care provider. Document Released: 06/17/2001 Document Revised: 06/04/2016 Document Reviewed: 06/04/2016 Elsevier Interactive Patient Education  2019 [employer]. How a  Baby Grows During Pregnancy  Pregnancy begins when a female's sperm enters a female's egg (fertilization). Fertilization usually happens in one of the tubes (fallopian tubes) that connect the ovaries to the womb (uterus). The fertilized egg moves down the fallopian tube to the uterus. Once it reaches the uterus, it implants into the lining of the uterus and begins to grow. For the first 10 weeks, the fertilized egg is called an embryo. After 10 weeks, it is called a fetus. As the fetus continues to grow, it receives oxygen and nutrients through tissue (placenta) that grows to support the developing baby. The placenta is the life support system for the baby. It provides oxygen and nutrition and removes waste. Learning as much as you can about your pregnancy and how your baby is developing can help you enjoy the experience. It can also make you aware of when there might be a problem and when to ask questions. How long does a typical pregnancy last? A pregnancy usually lasts 280 days, or about 40 weeks. Pregnancy is divided into three periods of growth, also called trimesters:  First trimester: 0-12 weeks.  Second trimester: 13-27 weeks.  Third trimester: 28-40 weeks. The day when your baby is ready to be born (full term) is your estimated date of delivery. How does my baby develop month by month? First month  The fertilized egg attaches to the inside of the uterus.  Some  cells will form the placenta. Others will form the fetus.  The arms, legs, brain, spinal cord, lungs, and heart begin to develop.  At the end of the first month, the heart begins to beat. Second month  The bones, inner ear, eyelids, hands, and feet form.  The genitals develop.  By the end of 8 weeks, all major organs are developing. Third month  All of the internal organs are forming.  Teeth develop below the gums.  Bones and muscles begin to grow. The spine can flex.  The skin is transparent.  Fingernails and toenails begin to form.  Arms and legs continue to grow longer, and hands and feet develop.  The fetus is about 3 inches (7.6 cm) long. Fourth month  The placenta is completely formed.  The external sex organs, neck, outer ear, eyebrows, eyelids, and fingernails are formed.  The fetus can hear, swallow, and move its arms and legs.  The kidneys begin to produce urine.  The skin is covered with a white, waxy coating (vernix) and very fine hair (lanugo). Fifth month  The fetus moves around more and can be felt for the first time (quickening).  The fetus starts to sleep and wake up and may begin to suck its finger.  The nails grow to the end of the fingers.  The organ in the digestive system that makes bile (gallbladder) functions and helps to digest nutrients.  If your baby is a girl, eggs are present in her ovaries. If your baby is a boy, testicles start to move down into his scrotum. Sixth month  The lungs are formed.  The eyes open. The brain continues to develop.  Your baby has fingerprints and toe prints. Your baby's hair grows thicker.  At the end of the second trimester, the fetus is about 9 inches (22.9 cm) long. Seventh month  The fetus kicks and stretches.  The eyes are developed enough to sense changes in light.  The hands can make a grasping motion.  The fetus responds to sound. Eighth month  All organs and  body systems are fully  developed and functioning.  Bones harden, and taste buds develop. The fetus may hiccup.  Certain areas of the brain are still developing. The skull remains soft. Ninth month  The fetus gains about  lb (0.23 kg) each week.  The lungs are fully developed.  Patterns of sleep develop.  The fetus's head typically moves into a head-down position (vertex) in the uterus to prepare for birth.  The fetus weighs 6-9 lb (2.72-4.08 kg) and is 19-20 inches (48.26-50.8 cm) long. What can I do to have a healthy pregnancy and help my baby develop? General instructions  Take prenatal vitamins as directed by your health care provider. These include vitamins such as folic acid, iron, calcium, and vitamin D. They are important for healthy development.  Take medicines only as directed by your health care provider. Read labels and ask a pharmacist or your health care provider whether over-the-counter medicines, supplements, and prescription drugs are safe to take during pregnancy.  Keep all follow-up visits as directed by your health care provider. This is important. Follow-up visits include prenatal care and screening tests. How do I know if my baby is developing well? At each prenatal visit, your health care provider will do several different tests to check on your health and keep track of your baby's development. These include:  Fundal height and position. ? Your health care provider will measure your growing belly from your pubic bone to the top of the uterus using a tape measure. ? Your health care provider will also feel your belly to determine your baby's position.  Heartbeat. ? An ultrasound in the first trimester can confirm pregnancy and show a heartbeat, depending on how far along you are. ? Your health care provider will check your baby's heart rate at every prenatal visit.  Second trimester ultrasound. ? This ultrasound checks your baby's development. It also may show your baby's  gender. What should I do if I have concerns about my baby's development? Always talk with your health care provider about any concerns that you may have about your pregnancy and your baby. Summary  A pregnancy usually lasts 280 days, or about 40 weeks. Pregnancy is divided into three periods of growth, also called trimesters.  Your health care provider will monitor your baby's growth and development throughout your pregnancy.  Follow your health care provider's recommendations about taking prenatal vitamins and medicines during your pregnancy.  Talk with your health care provider if you have any concerns about your pregnancy or your developing baby. This information is not intended to replace advice given to you by your health care provider. Make sure you discuss any questions you have with your health care provider. Document Released: 12/10/2007 Document Revised: 05/06/2017 Document Reviewed: 05/06/2017 Elsevier Interactive Patient Education  2019 [employer] Education Options: [facility] Department Classes:  Childbirth education classes can help you get ready for a positive parenting experience. You can also meet other expectant parents and get free stuff for your baby. Each class runs for five weeks on the same night and costs $45 for the mother-to-be and her support person. Medicaid covers the cost if you are eligible. Call [phone] to register. [facility] Childbirth Education:  [phone] or [phone] or [email or web address]  Baby & Me Class: Discuss newborn & infant parenting and family adjustment issues with other new mothers in a relaxed environment. Each week brings a new speaker or baby-centered activity. We encourage new mothers to join  [place] every Thursday at 11:00am. Babies birth until 31. No registration or fee. Daddy [employer]: This course offers Dads-to-be the tools and knowledge needed to feel confident on their journey  to becoming new fathers. Experienced dads, who have been trained as coaches, teach dads-to-be how to hold, comfort, diaper, swaddle and play with their infant while being able to support the new mom as well. A class for men taught by men. $25/dad Big Brother/Big Sister: Let your children share in the joy of a new brother or sister in this special class designed just for them. Class includes discussion about how families care for babies: swaddling, holding, diapering, safety as well as how they can be helpful in their new role. This class is designed for children ages 65 to 16, but any age is welcome. Please register each child individually. $5/child  Mom Talk: This mom-led group offers support and connection to mothers as they journey through the adjustments and struggles of that sometimes overwhelming first year after the birth of a child. Tuesdays at 10:00am and Thursdays at 6:00pm. Babies welcome. No registration or fee. Breastfeeding Support Group: This group is a mother-to-mother support circle where moms have the opportunity to share their breastfeeding experiences. A Lactation Consultant is present for questions and concerns. Meets each Tuesday at 11:00am. No fee or registration. Breastfeeding Your Baby: Learn what to expect in the first days of breastfeeding your newborn.  This class will help you feel more confident with the skills needed to begin your breastfeeding experience. Many new mothers are concerned about breastfeeding after leaving the hospital. This class will also address the most common fears and challenges about breastfeeding during the first few weeks, months and beyond. (call for fee) Comfort Techniques and Tour: This 2 hour interactive class will provide you the opportunity to learn & practice hands-on techniques that can help relieve some of the discomfort of labor and encourage your baby to rotate toward the best position for birth. You and your partner will be able to try a variety of  labor positions with birth balls and rebozos as well as practice breathing, relaxation, and visualization techniques. A tour of the [facility] is included with this class. $20 per registrant and support person Childbirth Class- Weekend Option: This class is a Weekend version of our Birth & Baby series. It is designed for parents who have a difficult time fitting several weeks of classes into their schedule. It covers the care of your newborn and the basics of labor and childbirth. It also includes a [place] [facility] and lunch. The class is held two consecutive days: beginning on Friday evening from 6:30 - 8:30 p.m. and the next day, Saturday from 9 a.m. - 4 p.m. (call for fee) Doren Custard Class: Interested in a waterbirth?  This informational class will help you discover whether waterbirth is the right fit for you. Education about waterbirth itself, supplies you would need and how to assemble your support team is what you can expect from this class. Some obstetrical practices require this class in order to pursue a waterbirth. (Not all obstetrical practices offer waterbirth-check with your healthcare provider.) Register only the expectant mom, but you are encouraged to bring your partner to class! Required if planning waterbirth, no fee. Infant/Child CPR: Parents, grandparents, babysitters, and friends learn Cardio-Pulmonary Resuscitation skills for infants and children. You will also learn how to treat both conscious and unconscious choking in infants and children. This Family &  Friends program does not Tree surgeon. Register each participant individually to ensure that enough mannequins are available. (Call for fee) Grandparent Love: Expecting a grandbaby? This class is for you! Learn about the latest infant care and safety recommendations and ways to support your own child as he or she transitions into the parenting role. Taught by Registered  Nurses who are childbirth instructors, but most importantly...they are grandmothers too! $10/person. Childbirth Class- Natural Childbirth: This series of 5 weekly classes is for expectant parents who want to learn and practice natural methods of coping with the process of labor and childbirth. Relaxation, breathing, massage, visualization, role of the partner, and helpful positioning are highlighted. Participants learn how to be confident in their body's ability to give birth. This class will empower and help parents make informed decisions about their own care. Includes discussion that will help new parents transition into the immediate postpartum period. [facility] is included. We suggest taking this class between 25-32 weeks, but it's only a recommendation. $75 per registrant and one support person or $30 Medicaid. Childbirth Class- 3 week Series: This option of 3 weekly classes helps you and your labor partner prepare for childbirth. Newborn care, labor & birth, cesarean birth, pain management, and comfort techniques are discussed and a [place] [facility] is included. The class meets at the same time, on the same day of the week for 3 consecutive weeks beginning with the starting date you choose. $60 for registrant and one support person.  Marvelous Multiples: Expecting twins, triplets, or more? This class covers the differences in labor, birth, parenting, and breastfeeding issues that face multiples' parents. NICU tour is included. Led by a Certified Childbirth Educator who is the mother of twins. No fee. Caring for Baby: This class is for expectant and adoptive parents who want to learn and practice the most up-to-date newborn care for their babies. Focus is on birth through the first six weeks of life. Topics include feeding, bathing, diapering, crying, umbilical cord care, circumcision care and safe sleep. Parents learn to recognize symptoms  of illness and when to call the pediatrician. Register only the mom-to-be and your partner or support person can plan to come with you! $10 per registrant and support person Childbirth Class- online option: This online class offers you the freedom to complete a Birth and Baby series in the comfort of your own home. The flexibility of this option allows you to review sections at your own pace, at times convenient to you and your support people. It includes additional video information, animations, quizzes, and extended activities. Get organized with helpful eClass tools, checklists, and trackers. Once you register online for the class, you will receive an email within a few days to accept the invitation and begin the class when the time is right for you. The content will be available to you for 60 days. $60 for 60 days of online access for you and your support people.  Local Doulas: Natural Baby Doulas [email or web address] Tel: [phone] [email or web address]/ [employer] [phone] [email or web address] .com www.piedmontdoulas.com The [facility]  (also do waterbirth tub rental) [phone] [email or web address]_[email or web address] .com https://www.[email or web address]/ [facility] [phone] [email or web address] .[email or web address] NotebookDistributors.fi Sacred Rhythms  [phone] https://sacred-rhythms.com/ [employer] (PADA) [email or web address] .[email or web address] https://www.frey.org/ [facility]  http://[email or web address]/ Considering Waterbirth? Guide for patients at [facility] [employer] [facility]  Why consider waterbirth?  . Gentle birth for babies . Less pain medicine used in labor . May allow for passive descent/less pushing .  May reduce perineal tears  . More mobility and instinctive maternal position changes . Increased maternal relaxation . Reduced blood pressure in labor  Is waterbirth safe? What are the risks of  infection, drowning or other complications?  . Infection: o Very low risk (3.7 % for tub vs 4.8% for bed) o 7 in 8000 waterbirths with documented infection o Poorly cleaned equipment most common cause o Slightly lower group B strep transmission rate  . Drowning o Maternal:  - Very low risk   - Related to seizures or fainting o Newborn:  - Very low risk. No evidence of increased risk of respiratory problems in multiple large studies - Physiological protection from breathing under water - Avoid underwater birth if there are any fetal complications - Once baby's head is out of the water, keep it out.  . Birth complication o Some reports of cord trauma, but risk decreased by bringing baby to surface gradually o No evidence of increased risk of shoulder dystocia. Mothers can usually change positions faster in water than in a bed, possibly aiding the maneuvers to free the shoulder.   You must attend a Doren Custard class at [facility]  3rd Wednesday of every month from 7-9pm  [employer] by calling [phone] or online at [email or web address]  Bring [place] the certificate from the class to your prenatal appointment  Meet with a midwife at 36 weeks to see if you can still plan a waterbirth and to sign the consent.   Purchase or rent the following supplies:   Water Birth Pool (Birth Pool in a Box or [place] for instance)  (Tubs start ~$125)  Single-use disposable tub liner designed for your brand of tub  New garden hose labeled "lead-free", "suitable for drinking water",  Electric drain pump to remove water (We recommend 792 gallon per hour or greater pump.)   Separate garden hose to remove the dirty water  Fish net  Bathing suit top (optional)  Long-handled mirror (optional)  Places to purchase or rent supplies  GotWebTools.is for tub purchases and supplies  Waterbirthsolutions.com for tub purchases and supplies  The Labor Ladies  (www.thelaborladies.com) $275 for tub rental/set-up & take down/kit   [employer] (http://www.fleming.com/.htm) Information regarding doulas (labor support) who provide pool rentals  Our practice has a Birth Pool in a Box tub at the hospital that you may borrow on a first-come-first-served basis. It is your responsibility to to set up, clean and break down the tub. We cannot guarantee the availability of this tub in advance. You are responsible for bringing all accessories listed above. If you do not have all necessary supplies you cannot have a waterbirth.    Things that would prevent you from having a waterbirth:  Premature, <37wks  Previous cesarean birth  Presence of thick meconium-stained fluid  Multiple gestation (Twins, triplets, etc.)  Uncontrolled diabetes or gestational diabetes requiring medication  Hypertension requiring medication or diagnosis of pre-eclampsia  Heavy vaginal bleeding  Non-reassuring fetal heart rate  Active infection (MRSA, etc.). Group B Strep is NOT a contraindication for  waterbirth.  If your labor has to be induced and induction method requires continuous  monitoring of the baby's heart rate  Other risks/issues identified by your obstetrical provider  Please remember that birth is unpredictable. Under certain unforeseeable circumstances your provider may advise against giving birth in the tub. These decisions will be made on a case-by-case basis and with the safety of you and your baby as our highest priority.

## 2018-07-21 ENCOUNTER — Encounter (HOSPITAL_COMMUNITY): Payer: Self-pay

## 2018-07-22 LAB — CERVICOVAGINAL ANCILLARY ONLY
Chlamydia: NEGATIVE
Neisseria Gonorrhea: NEGATIVE
TRICH (WINDOWPATH): NEGATIVE

## 2018-07-22 LAB — URINE CULTURE, OB REFLEX: Organism ID, Bacteria: NO GROWTH

## 2018-07-22 LAB — CULTURE, OB URINE

## 2018-08-03 LAB — OBSTETRIC PANEL, INCLUDING HIV
ANTIBODY SCREEN: NEGATIVE
BASOS: 1 %
Basophils Absolute: 0 10*3/uL (ref 0.0–0.2)
EOS (ABSOLUTE): 0.1 10*3/uL (ref 0.0–0.4)
Eos: 1 %
HEP B S AG: NEGATIVE
HIV SCREEN 4TH GENERATION: NONREACTIVE
Hematocrit: 39 % (ref 34.0–46.6)
Hemoglobin: 13.4 g/dL (ref 11.1–15.9)
Immature Grans (Abs): 0 10*3/uL (ref 0.0–0.1)
Immature Granulocytes: 0 %
LYMPHS ABS: 1.7 10*3/uL (ref 0.7–3.1)
LYMPHS: 27 %
MCH: 29.5 pg (ref 26.6–33.0)
MCHC: 34.4 g/dL (ref 31.5–35.7)
MCV: 86 fL (ref 79–97)
Monocytes Absolute: 0.3 10*3/uL (ref 0.1–0.9)
Monocytes: 4 %
NEUTROS ABS: 4.1 10*3/uL (ref 1.4–7.0)
NEUTROS PCT: 67 %
PLATELETS: 258 10*3/uL (ref 150–450)
RBC: 4.54 x10E6/uL (ref 3.77–5.28)
RDW: 13 % (ref 11.7–15.4)
RPR Ser Ql: NONREACTIVE
Rh Factor: POSITIVE
Rubella Antibodies, IGG: 2.36 index (ref >0.99)
WBC: 6.2 10*3/uL (ref 3.4–10.8)

## 2018-08-03 LAB — HEMOGLOBINOPATHY EVALUATION
Ferritin: 41 ng/mL (ref 15–150)
HGB SOLUBILITY: NEGATIVE
HGB VARIANT: 0 %
Hgb A2 Quant: 2.2 % (ref 1.8–3.2)
Hgb A: 97.8 % (ref 96.4–98.8)
Hgb C: 0 %
Hgb F Quant: 0 % (ref 0.0–2.0)
Hgb S: 0 %

## 2018-08-03 LAB — INHERITEST(R) CF/SMA PANEL

## 2018-08-05 ENCOUNTER — Encounter: Payer: Self-pay | Admitting: Emergency Medicine

## 2018-08-16 ENCOUNTER — Encounter: Payer: Self-pay | Admitting: Nurse Practitioner

## 2018-08-16 ENCOUNTER — Ambulatory Visit (INDEPENDENT_AMBULATORY_CARE_PROVIDER_SITE_OTHER): Payer: Non-veteran care | Admitting: Nurse Practitioner

## 2018-08-16 VITALS — BP 120/78 | HR 69 | Wt 134.8 lb

## 2018-08-16 DIAGNOSIS — Z3490 Encounter for supervision of normal pregnancy, unspecified, unspecified trimester: Secondary | ICD-10-CM

## 2018-08-16 DIAGNOSIS — Z3492 Encounter for supervision of normal pregnancy, unspecified, second trimester: Secondary | ICD-10-CM

## 2018-08-16 NOTE — Progress Notes (Signed)
    Subjective:  Lori Wright is a 33 y.o. [phone] at [redacted]w[redacted]d being seen today for ongoing prenatal care.  She is currently monitored for the following issues for this low-risk pregnancy and has Supervision of low-risk pregnancy on their problem list.  Patient reports occasional nausea, no vomiting, declines any medication.  Contractions: Not present. Vag. Bleeding: None.   . Denies leaking of fluid.   The following portions of the patient's history were reviewed and updated as appropriate: allergies, current medications, past family history, past medical history, past social history, past surgical history and problem list. Problem list updated.  Objective:   Vitals:   08/16/18 1025  BP: 120/78  Pulse: 69  Weight: 134 lb 12.8 oz (61.1 kg)    Fetal Status: Fetal Heart Rate (bpm): 143 Fundal Height: 15 cm       General:  Alert, oriented and cooperative. Patient is in no acute distress.  Skin: Skin is warm and dry. No rash noted.   Cardiovascular: Normal heart rate noted  Respiratory: Normal respiratory effort, no problems with respiration noted  Abdomen: Soft, gravid, appropriate for gestational age. Pain/Pressure: Absent     Pelvic:  Cervical exam deferred        Extremities: Normal range of motion.  Edema: None  Mental Status: Normal mood and affect. Normal behavior. Normal judgment and thought content.   Urinalysis:      Assessment and Plan:  Pregnancy: [id number] at [redacted]w[redacted]d  1. Encounter for supervision of low-risk pregnancy, antepartum Scheduled for anatomy ultrasound Declines any genetic testing including quad screen  Preterm labor symptoms and general obstetric precautions including but not limited to vaginal bleeding, contractions, leaking of fluid and fetal movement were reviewed in detail with the patient. Please refer to After Visit Summary for other counseling recommendations.  Return in about 4 weeks (around 09/13/2018).  Nolene Bernheim, RN, MSN, NP-BC Nurse  Practitioner, [facility] for [employer], [facility] 08/16/2018 11:11 AM

## 2018-09-13 ENCOUNTER — Encounter: Payer: Self-pay | Admitting: Advanced Practice Midwife

## 2018-09-13 ENCOUNTER — Ambulatory Visit (INDEPENDENT_AMBULATORY_CARE_PROVIDER_SITE_OTHER): Payer: Non-veteran care | Admitting: Advanced Practice Midwife

## 2018-09-13 VITALS — BP 113/66 | HR 83 | Wt 136.0 lb

## 2018-09-13 DIAGNOSIS — Z3492 Encounter for supervision of normal pregnancy, unspecified, second trimester: Secondary | ICD-10-CM

## 2018-09-13 DIAGNOSIS — Z3490 Encounter for supervision of normal pregnancy, unspecified, unspecified trimester: Secondary | ICD-10-CM

## 2018-09-13 DIAGNOSIS — Z3A18 18 weeks gestation of pregnancy: Secondary | ICD-10-CM

## 2018-09-13 LAB — POCT URINALYSIS DIP (DEVICE)
Bilirubin Urine: NEGATIVE
Glucose, UA: NEGATIVE mg/dL
HGB URINE DIPSTICK: NEGATIVE
Ketones, ur: NEGATIVE mg/dL
Leukocytes,Ua: NEGATIVE
Nitrite: NEGATIVE
Protein, ur: NEGATIVE mg/dL
Specific Gravity, Urine: <=1.005 (ref 1.005–1.030)
Urobilinogen, UA: 0.2 mg/dL (ref 0.0–1.0)
pH: 6 (ref 5.0–8.0)

## 2018-09-13 NOTE — Progress Notes (Signed)
   PRENATAL VISIT NOTE  Subjective:  Lori Wright is a 33 y.o. [phone] at [redacted]w[redacted]d being seen today for ongoing prenatal care.  She is currently monitored for the following issues for this low-risk pregnancy and has Supervision of low-risk pregnancy on their problem list.  Patient reports low backache and change in urine odor.  Contractions: Not present. Vag. Bleeding: None.  Movement: Present. Denies leaking of fluid.   The following portions of the patient's history were reviewed and updated as appropriate: allergies, current medications, past family history, past medical history, past social history, past surgical history and problem list. Problem list updated.  Objective:   Vitals:   09/13/18 1103  BP: 113/66  Pulse: 83  Weight: 61.7 kg    Fetal Status: Fetal Heart Rate (bpm): 146 Fundal Height: 18 cm Movement: Present     General:  Alert, oriented and cooperative. Patient is in no acute distress.  Skin: Skin is warm and dry. No rash noted.   Cardiovascular: Normal heart rate noted  Respiratory: Normal respiratory effort, no problems with respiration noted  Abdomen: Soft, gravid, appropriate for gestational age.  Pain/Pressure: Absent     Pelvic: Cervical exam deferred        Extremities: Normal range of motion.  Edema: None  Mental Status: Normal mood and affect. Normal behavior. Normal judgment and thought content.   Assessment and Plan:  Pregnancy: [id number] at [redacted]w[redacted]d  1. Encounter for supervision of low-risk pregnancy, antepartum - Culture, OB Urine (normal) - Advised her to begin daily low back stretches and see her chiropractor (she sees Daron Offer at [facility])  Preterm labor symptoms and general obstetric precautions including but not limited to vaginal bleeding, contractions, leaking of fluid and fetal movement were reviewed in detail with the patient. Please refer to After Visit Summary for other counseling recommendations.  Return in about 4 weeks  (around 10/11/2018) for ROB.  Future Appointments  Date Time Provider Department Center  09/22/2018 10:15 AM WH-MFC [place] 4 WH-MFCUS MFC-US    Bernerd Limbo, Student-MidWife

## 2018-09-13 NOTE — Progress Notes (Signed)
Lower back pain since yesterday

## 2018-09-15 ENCOUNTER — Encounter (HOSPITAL_COMMUNITY): Payer: Self-pay

## 2018-09-15 LAB — CULTURE, OB URINE

## 2018-09-15 LAB — URINE CULTURE, OB REFLEX

## 2018-09-22 ENCOUNTER — Ambulatory Visit (HOSPITAL_COMMUNITY)
Admission: RE | Admit: 2018-09-22 | Discharge: 2018-09-22 | Disposition: A | Discharge status: Home / Self Care | Payer: No Typology Code available for payment source | Source: Ambulatory Visit | Attending: Nurse Practitioner | Admitting: Nurse Practitioner

## 2018-09-22 ENCOUNTER — Other Ambulatory Visit: Payer: Self-pay

## 2018-09-22 DIAGNOSIS — Z3490 Encounter for supervision of normal pregnancy, unspecified, unspecified trimester: Secondary | ICD-10-CM | POA: Insufficient documentation

## 2018-09-22 DIAGNOSIS — Z3A19 19 weeks gestation of pregnancy: Secondary | ICD-10-CM

## 2018-09-22 DIAGNOSIS — Z363 Encounter for antenatal screening for malformations: Secondary | ICD-10-CM | POA: Diagnosis not present

## 2018-10-07 ENCOUNTER — Ambulatory Visit (INDEPENDENT_AMBULATORY_CARE_PROVIDER_SITE_OTHER): Payer: Non-veteran care | Admitting: *Deleted

## 2018-10-07 ENCOUNTER — Other Ambulatory Visit: Payer: Self-pay

## 2018-10-07 DIAGNOSIS — Z3490 Encounter for supervision of normal pregnancy, unspecified, unspecified trimester: Secondary | ICD-10-CM

## 2018-10-07 NOTE — Progress Notes (Signed)
Here to pick up blood pressure cuff. Also ordered babyscripts optimization and had patient download app and get started. Given Omron bp cuff.  Demonstrated how to use blood pressure cuff. Unable to have patient take blood pressure due to we do not have batteries. Instructed patient to buy 4 AA batteries and install. Instructed to call us with questions or issues or send Mychart message. She also had concerns re: virtual visits. Support given. Explained to monitor herself and her baby for decreased movement, bleeding, leaking, contractions and call if concerns or go to hospital if urgent. She voices understanding.

## 2018-10-07 NOTE — Progress Notes (Signed)
Patient seen and assessed by nursing staff.  Agree with documentation and plan.  

## 2018-10-11 ENCOUNTER — Encounter: Payer: Non-veteran care | Admitting: Advanced Practice Midwife

## 2018-10-21 ENCOUNTER — Telehealth: Payer: Self-pay | Admitting: General Practice

## 2018-10-21 ENCOUNTER — Telehealth: Payer: Self-pay | Admitting: Family Medicine

## 2018-10-21 NOTE — Telephone Encounter (Signed)
Called patient about her appointment on 05/20 for her glucose test. She stated she had jury duty and needed to change her appointment. Requested to change appointment to the following week. Per Dr Shawnie Pons she was to be postponed 7 weeks with 28 week labs.

## 2018-10-21 NOTE — Telephone Encounter (Signed)
Called patient regarding checking blood pressures for babyscripts. Patient states her batteries died and she is waiting for her husband to go out and get more. Patient states she is trying to get her appt rescheduled for 5/20 because she has jury duty that day. Patient states she has been trying to reach [place] for 3 weeks but no one has changed her appt yet despite being told someone would. Apologized to patient and explained we recently moved. Discussed I would inform the front office team lead and someone would be in touch with her. Patient verbalized understanding & had no questions.

## 2018-11-24 ENCOUNTER — Other Ambulatory Visit: Payer: Non-veteran care

## 2018-11-26 ENCOUNTER — Telehealth: Payer: Self-pay | Admitting: Obstetrics & Gynecology

## 2018-11-26 NOTE — Telephone Encounter (Signed)
Called the patient to confirm upcoming appointment. The patient verbalized understanding. °

## 2018-11-30 ENCOUNTER — Ambulatory Visit (INDEPENDENT_AMBULATORY_CARE_PROVIDER_SITE_OTHER): Payer: No Typology Code available for payment source | Admitting: Student

## 2018-11-30 ENCOUNTER — Other Ambulatory Visit: Payer: Non-veteran care

## 2018-11-30 ENCOUNTER — Other Ambulatory Visit: Payer: Self-pay

## 2018-11-30 DIAGNOSIS — Z3490 Encounter for supervision of normal pregnancy, unspecified, unspecified trimester: Secondary | ICD-10-CM

## 2018-11-30 DIAGNOSIS — Z3A29 29 weeks gestation of pregnancy: Secondary | ICD-10-CM

## 2018-11-30 DIAGNOSIS — Z23 Encounter for immunization: Secondary | ICD-10-CM

## 2018-11-30 DIAGNOSIS — Z3493 Encounter for supervision of normal pregnancy, unspecified, third trimester: Secondary | ICD-10-CM

## 2018-11-30 NOTE — Progress Notes (Addendum)
   PRENATAL VISIT NOTE  Subjective:  Lori Wright is a 33 y.o. [phone] at 105w2d being seen today for ongoing prenatal care.  She is currently monitored for the following issues for this low-risk pregnancy and has Supervision of low-risk pregnancy on their problem list.  Patient reports no complaints.  Contractions: Not present. Vag. Bleeding: None.  Movement: Present. Denies leaking of fluid.   The following portions of the patient's history were reviewed and updated as appropriate: allergies, current medications, past family history, past medical history, past social history, past surgical history and problem list.   Objective:   Vitals:   11/30/18 0927  BP: 110/67  Pulse: 79  Temp: 97.8 F (36.6 C)  Weight: 137 lb 12.8 oz (62.5 kg)    Fetal Status: Fetal Heart Rate (bpm): 141 Fundal Height: 28 cm Movement: Present     General:  Alert, oriented and cooperative. Patient is in no acute distress.  Skin: Skin is warm and dry. No rash noted.   Cardiovascular: Normal heart rate noted  Respiratory: Normal respiratory effort, no problems with respiration noted  Abdomen: Soft, gravid, appropriate for gestational age.  Pain/Pressure: Absent     Pelvic: Cervical exam deferred        Extremities: Normal range of motion.  Edema: None  Mental Status: Normal mood and affect. Normal behavior. Normal judgment and thought content.   Assessment and Plan:  Pregnancy: [id number] at [redacted]w[redacted]d 1. Encounter for supervision of low-risk pregnancy, antepartum -GTT in process -Reviewed BPs in MyChart; all normal.  -Explained Mychart visits in the future and what to expect at future visits reviewed warning signs and when to come to MAU - Tdap vaccine greater than or equal to 7yo IM  Preterm labor symptoms and general obstetric precautions including but not limited to vaginal bleeding, contractions, leaking of fluid and fetal movement were reviewed in detail with the patient. Please refer to After Visit  Summary for other counseling recommendations.   Return in about 4 weeks (around 12/28/2018), or My chart LROB in 4 weeks.  No future appointments.  Marylene Land, CNM

## 2018-11-30 NOTE — Patient Instructions (Signed)

## 2018-12-01 LAB — GLUCOSE TOLERANCE, 2 HOURS W/ 1HR
Glucose, 1 hour: 105 mg/dL (ref 65–179)
Glucose, 2 hour: 83 mg/dL (ref 65–152)
Glucose, Fasting: 73 mg/dL (ref 65–91)

## 2018-12-01 LAB — CBC
Hematocrit: 33.4 % — ABNORMAL LOW (ref 34.0–46.6)
Hemoglobin: 11.6 g/dL (ref 11.1–15.9)
MCH: 29.4 pg (ref 26.6–33.0)
MCHC: 34.7 g/dL (ref 31.5–35.7)
MCV: 85 fL (ref 79–97)
Platelets: 207 10*3/uL (ref 150–450)
RBC: 3.95 x10E6/uL (ref 3.77–5.28)
RDW: 13.1 % (ref 11.7–15.4)
WBC: 8 10*3/uL (ref 3.4–10.8)

## 2018-12-01 LAB — HIV ANTIBODY (ROUTINE TESTING W REFLEX): HIV Screen 4th Generation wRfx: NONREACTIVE

## 2018-12-01 LAB — RPR: RPR Ser Ql: NONREACTIVE

## 2018-12-27 ENCOUNTER — Telehealth: Payer: Self-pay | Admitting: Advanced Practice Midwife

## 2018-12-27 NOTE — Telephone Encounter (Signed)
Called the patient to confirm the appointment. She stated she has completed a mychart visit previously. Informed of logging in 15 minutes prior to the appointment.

## 2018-12-28 ENCOUNTER — Telehealth (INDEPENDENT_AMBULATORY_CARE_PROVIDER_SITE_OTHER): Payer: No Typology Code available for payment source

## 2018-12-28 ENCOUNTER — Other Ambulatory Visit: Payer: Self-pay

## 2018-12-28 VITALS — BP 118/78 | HR 78 | Wt 136.8 lb

## 2018-12-28 DIAGNOSIS — Z3A33 33 weeks gestation of pregnancy: Secondary | ICD-10-CM

## 2018-12-28 DIAGNOSIS — Z3490 Encounter for supervision of normal pregnancy, unspecified, unspecified trimester: Secondary | ICD-10-CM

## 2018-12-28 DIAGNOSIS — Z3493 Encounter for supervision of normal pregnancy, unspecified, third trimester: Secondary | ICD-10-CM

## 2018-12-28 NOTE — Progress Notes (Signed)
I connected with  Lori Wright on 12/28/18 at  8:55 AM EDT by telephone and verified that I am speaking with the correct person using two identifiers.   I discussed the limitations, risks, security and privacy concerns of performing an evaluation and management service by telephone and the availability of in person appointments. I also discussed with the patient that there may be a patient responsible charge related to this service. The patient expressed understanding and agreed to proceed.  [name], CMA 12/28/2018  8:46 AM

## 2018-12-28 NOTE — Progress Notes (Signed)
   TELEHEALTH VIRTUAL OBSTETRICS VISIT ENCOUNTER NOTE  I connected with Martinique Lace Bulls on 12/28/18 at  8:55 AM EDT by MyChart Virtual Visit at home and verified that I am speaking with the correct person using two identifiers.   I discussed the limitations, risks, security and privacy concerns of performing an evaluation and management service by telephone and the availability of in person appointments. I also discussed with the patient that there may be a patient responsible charge related to this service. The patient expressed understanding and agreed to proceed.  Subjective:  Martinique Lace Froio is a 33 y.o. [phone] at [redacted]w[redacted]d being followed for ongoing prenatal care.  She is currently monitored for the following issues for this low-risk pregnancy and has Supervision of low-risk pregnancy on their problem list.  Patient reports no complaints. Reports fetal movement. Denies any contractions, bleeding or leaking of fluid.   The following portions of the patient's history were reviewed and updated as appropriate: allergies, current medications, past family history, past medical history, past social history, past surgical history and problem list.   Objective:   General:  Alert, oriented and cooperative.   Mental Status: Normal mood and affect perceived. Normal judgment and thought content.  Rest of physical exam deferred due to type of encounter  Assessment and Plan:  Pregnancy: [id number] at [redacted]w[redacted]d 1. Encounter for supervision of low-risk pregnancy, antepartum -BP 118/74 -Labs from last visit reviewed [facility] location reviewed with patient. Link to virtual tour provided -Counseled patient on routine COVID-19 testing for all admission to inpatient units whether direct admit or from MAU/ED. Reviewed reasons for testing including identifying cases and protecting patients/staff from possible infection. Reassured patient that separation from newborn is not required for COVID+ tests in asymptomatic  patients and that the plan of care will be created with Peds through shared decision-making. One support person is allowed for all admitted patients regardless of COVID test results. All patient questions answered.   -Anticipatory guidance of next visits reviewed with patient  Preterm labor symptoms and general obstetric precautions including but not limited to vaginal bleeding, contractions, leaking of fluid and fetal movement were reviewed in detail with the patient.  I discussed the assessment and treatment plan with the patient. The patient was provided an opportunity to ask questions and all were answered. The patient agreed with the plan and demonstrated an understanding of the instructions. The patient was advised to call back or seek an in-person office evaluation/go to [facility] at [facility] for any urgent or concerning symptoms. Please refer to After Visit Summary for other counseling recommendations.   I provided 15 minutes of non-face-to-face time during this encounter.  Return in about 3 weeks (around 01/18/2019) for Return OB visit/GBS.  No future appointments.  Wende Mott, [place] [facility] [employer], [place]

## 2019-01-20 ENCOUNTER — Other Ambulatory Visit (HOSPITAL_COMMUNITY)
Admission: RE | Admit: 2019-01-20 | Discharge: 2019-01-20 | Disposition: A | Discharge status: Home / Self Care | Payer: No Typology Code available for payment source | Source: Ambulatory Visit | Attending: Obstetrics & Gynecology | Admitting: Obstetrics & Gynecology

## 2019-01-20 ENCOUNTER — Ambulatory Visit (INDEPENDENT_AMBULATORY_CARE_PROVIDER_SITE_OTHER): Payer: No Typology Code available for payment source | Admitting: Obstetrics & Gynecology

## 2019-01-20 ENCOUNTER — Other Ambulatory Visit: Payer: Self-pay

## 2019-01-20 VITALS — BP 122/75 | HR 77 | Wt 140.0 lb

## 2019-01-20 DIAGNOSIS — Z3A36 36 weeks gestation of pregnancy: Secondary | ICD-10-CM

## 2019-01-20 DIAGNOSIS — Z3493 Encounter for supervision of normal pregnancy, unspecified, third trimester: Secondary | ICD-10-CM

## 2019-01-20 NOTE — Progress Notes (Signed)
   PRENATAL VISIT NOTE  Subjective:  Lori Wright is a 33 y.o. [phone] at [redacted]w[redacted]d being seen today for ongoing prenatal care.  She is currently monitored for the following issues for this low-risk pregnancy and has Supervision of low-risk pregnancy on their problem list.  Patient reports occasional contractions.  Contractions: Irritability. Vag. Bleeding: None.  Movement: Present. Denies leaking of fluid.   The following portions of the patient's history were reviewed and updated as appropriate: allergies, current medications, past family history, past medical history, past social history, past surgical history and problem list.   Objective:   Vitals:   01/20/19 0926  BP: 122/75  Pulse: 77  Weight: 140 lb (63.5 kg)    Fetal Status: Fetal Heart Rate (bpm): 154 Fundal Height: 35 cm Movement: Present  Presentation: Vertex  General:  Alert, oriented and cooperative. Patient is in no acute distress.  Skin: Skin is warm and dry. No rash noted.   Cardiovascular: Normal heart rate noted  Respiratory: Normal respiratory effort, no problems with respiration noted  Abdomen: Soft, gravid, appropriate for gestational age.  Pain/Pressure: Present     Pelvic: Cervical exam performed Dilation: 1 Effacement (%): 40 Station: Ballotable  Extremities: Normal range of motion.  Edema: None  Mental Status: Normal mood and affect. Normal behavior. Normal judgment and thought content.   Assessment and Plan:  Pregnancy: [id number] at [redacted]w[redacted]d 1. Encounter for supervision of low-risk pregnancy in third trimester Routine 36 weeks - GC/Chlamydia probe amp ()not at [facility] - Culture, beta strep (group b only)  Preterm labor symptoms and general obstetric precautions including but not limited to vaginal bleeding, contractions, leaking of fluid and fetal movement were reviewed in detail with the patient. Please refer to After Visit Summary for other counseling recommendations.   Return in about 1 week  (around 01/27/2019) for virtual.  No future appointments.  Emeterio Reeve, MD

## 2019-01-20 NOTE — Patient Instructions (Signed)

## 2019-01-24 LAB — CULTURE, BETA STREP (GROUP B ONLY): Strep Gp B Culture: NEGATIVE

## 2019-01-25 LAB — GC/CHLAMYDIA PROBE AMP (~~LOC~~) NOT AT ARMC
Chlamydia: NEGATIVE
Neisseria Gonorrhea: NEGATIVE

## 2019-01-27 ENCOUNTER — Telehealth: Payer: Self-pay | Admitting: Obstetrics and Gynecology

## 2019-01-27 NOTE — Telephone Encounter (Signed)
Called the patient to inform of the upcoming mychart visit. Left detailed message informing of logging in 15 minutes prior to the appointment time and also if the physician or nurse has not logged in by 15 minutes after the appointment time please call our office and we will follow up to physician.

## 2019-01-28 ENCOUNTER — Other Ambulatory Visit: Payer: Self-pay

## 2019-01-28 ENCOUNTER — Ambulatory Visit (INDEPENDENT_AMBULATORY_CARE_PROVIDER_SITE_OTHER): Payer: No Typology Code available for payment source | Admitting: Family Medicine

## 2019-01-28 VITALS — BP 116/85 | HR 86 | Wt 137.0 lb

## 2019-01-28 DIAGNOSIS — Z3493 Encounter for supervision of normal pregnancy, unspecified, third trimester: Secondary | ICD-10-CM

## 2019-01-28 DIAGNOSIS — Z3A37 37 weeks gestation of pregnancy: Secondary | ICD-10-CM

## 2019-01-28 NOTE — Progress Notes (Signed)
   [place] VIRTUAL VIDEO VISIT ENCOUNTER NOTE  Provider location: [facility] [employer] at [facility]   I connected with Lori Wright on 01/28/19 at  8:55 AM EDT by MyChart Video Encounter at home and verified that I am speaking with the correct person using two identifiers.   I discussed the limitations, risks, security and privacy concerns of performing an evaluation and management service virtually and the availability of in person appointments. I also discussed with the patient that there may be a patient responsible charge related to this service. The patient expressed understanding and agreed to proceed. Subjective:  Lori Wright is a 33 y.o. [phone] at [redacted]w[redacted]d being seen today for ongoing prenatal care.  She is currently monitored for the following issues for this low-risk pregnancy and has Supervision of low-risk pregnancy on their problem list.  Patient reports no complaints.  Contractions: Irritability. Vag. Bleeding: None.  Movement: Present. Denies any leaking of fluid.   The following portions of the patient's history were reviewed and updated as appropriate: allergies, current medications, past family history, past medical history, past social history, past surgical history and problem list.   Objective:   Vitals:   01/28/19 0847  BP: 116/85  Pulse: 86  Weight: 137 lb (62.1 kg)    Fetal Status:     Movement: Present     General:  Alert, oriented and cooperative. Patient is in no acute distress.  Respiratory: Normal respiratory effort, no problems with respiration noted  Mental Status: Normal mood and affect. Normal behavior. Normal judgment and thought content.  Rest of physical exam deferred due to type of encounter  Imaging: No results found.  Assessment and Plan:  Pregnancy: [id number] at [redacted]w[redacted]d 1. Encounter for supervision of low-risk pregnancy in third trimester Good fetal movement. No questions  Term labor symptoms and  general obstetric precautions including but not limited to vaginal bleeding, contractions, leaking of fluid and fetal movement were reviewed in detail with the patient. I discussed the assessment and treatment plan with the patient. The patient was provided an opportunity to ask questions and all were answered. The patient agreed with the plan and demonstrated an understanding of the instructions. The patient was advised to call back or seek an in-person office evaluation/go to MAU at [facility] [address] for any urgent or concerning symptoms. Please refer to After Visit Summary for other counseling recommendations.   I provided 9 minutes of face-to-face time during this encounter.  No follow-ups on file.  Future Appointments  Date Time Provider Department Center  02/02/2019  8:55 AM Lavonia Drafts, MD [facility] [place]  02/09/2019  9:15 AM Lavonia Drafts, MD [facility]  02/14/2019  9:15 AM Donnamae Jude, MD [place] for [employer], [place]

## 2019-02-02 ENCOUNTER — Other Ambulatory Visit: Payer: Self-pay

## 2019-02-02 ENCOUNTER — Encounter: Payer: Self-pay | Admitting: Obstetrics & Gynecology

## 2019-02-02 ENCOUNTER — Telehealth (INDEPENDENT_AMBULATORY_CARE_PROVIDER_SITE_OTHER): Payer: No Typology Code available for payment source | Admitting: Obstetrics & Gynecology

## 2019-02-02 DIAGNOSIS — Z3490 Encounter for supervision of normal pregnancy, unspecified, unspecified trimester: Secondary | ICD-10-CM

## 2019-02-02 DIAGNOSIS — Z3493 Encounter for supervision of normal pregnancy, unspecified, third trimester: Secondary | ICD-10-CM

## 2019-02-02 DIAGNOSIS — Z3A38 38 weeks gestation of pregnancy: Secondary | ICD-10-CM

## 2019-02-02 NOTE — Progress Notes (Signed)
   [place] VIRTUAL VIDEO VISIT ENCOUNTER NOTE  Provider location: Center for [employer] at [facility]   I connected with Martin[place] Lace Krzywicki on 02/02/19 at  8:55 AM EDT by MyChart Video Encounter at home and verified that I am speaking with the correct person using two identifiers.   I discussed the limitations, risks, security and privacy concerns of performing an evaluation and management service virtually and the availability of in person appointments. I also discussed with the patient that there may be a patient responsible charge related to this service. The patient expressed understanding and agreed to proceed. Subjective:  Martin[place] Lace Sedlar is a 33 y.o. [phone] at [redacted]w[redacted]d being seen today for ongoing prenatal care.  She is currently monitored for the following issues for this low-risk pregnancy and has Supervision of low-risk pregnancy on their problem list.  Patient reports no complaints.  Contractions: Irregular. Vag. Bleeding: None.  Movement: Present. Denies any leaking of fluid.   The following portions of the patient's history were reviewed and updated as appropriate: allergies, current medications, past family history, past medical history, past social history, past surgical history and problem list.   Objective:   Vitals:   02/02/19 0842  BP: 117/82  Pulse: 75  Weight: 137 lb 6.4 oz (62.3 kg)    Fetal Status:     Movement: Present     General:  Alert, oriented and cooperative. Patient is in no acute distress.  Respiratory: Normal respiratory effort, no problems with respiration noted  Mental Status: Normal mood and affect. Normal behavior. Normal judgment and thought content.  Rest of physical exam deferred due to type of encounter  Imaging: No results found.  Assessment and Plan:  Pregnancy: [id number] at [redacted]w[redacted]d 1. Encounter for supervision of low-risk pregnancy, antepartum Good FM.  Reviewed labor and L&D protochol  Term labor symptoms  and general obstetric precautions including but not limited to vaginal bleeding, contractions, leaking of fluid and fetal movement were reviewed in detail with the patient. I discussed the assessment and treatment plan with the patient. The patient was provided an opportunity to ask questions and all were answered. The patient agreed with the plan and demonstrated an understanding of the instructions. The patient was advised to call back or seek an in-person office evaluation/go to [facility] at [facility] for any urgent or concerning symptoms. Please refer to After Visit Summary for other counseling recommendations.   I provided 10 minutes of face-to-face time during this encounter.  No follow-ups on file.  Future Appointments  Date Time Provider Department Center  02/09/2019  9:15 AM Lavonia Drafts, MD [facility]  02/14/2019  9:15 AM Donnamae Jude, MD [place], [place] for [facility] [place]

## 2019-02-02 NOTE — Progress Notes (Signed)
I connected with  Lori Wright on 02/02/19 at  8:55 AM EDT by telephone and verified that I am speaking with the correct person using two identifiers.   I discussed the limitations, risks, security and privacy concerns of performing an evaluation and management service by telephone and virtually and the availability of in person appointments. I also discussed with the patient that there may be a patient responsible charge related to this service. The patient expressed understanding and agreed to proceed.  Dmetrius Ambs,RN 02/02/2019  8:40 AM

## 2019-02-09 ENCOUNTER — Other Ambulatory Visit: Payer: Self-pay

## 2019-02-09 ENCOUNTER — Encounter: Payer: No Typology Code available for payment source | Admitting: Obstetrics & Gynecology

## 2019-02-09 NOTE — Progress Notes (Signed)
Pt not seen.

## 2019-02-09 NOTE — Progress Notes (Signed)
I connected with  Lori Wright on 02/09/19 at  9:15 AM EDT by MyChart video and verified that I am speaking with the correct person using two identifiers.   I discussed the limitations, risks, security and privacy concerns of performing an evaluation and management service by telephone and the availability of in person appointments. I also discussed with the patient that there may be a patient responsible charge related to this service. The patient expressed understanding and agreed to proceed.  [name], CMA 02/09/2019  9:12 AM   Patient states she waited till 67 and had to take 33 yr old to appointment to get shots and she is feeling fine with no concerns today. And will follow up in a week as scheduled.

## 2019-02-14 ENCOUNTER — Other Ambulatory Visit: Payer: Self-pay

## 2019-02-14 ENCOUNTER — Ambulatory Visit (INDEPENDENT_AMBULATORY_CARE_PROVIDER_SITE_OTHER): Payer: No Typology Code available for payment source | Admitting: Family Medicine

## 2019-02-14 ENCOUNTER — Ambulatory Visit: Payer: No Typology Code available for payment source | Admitting: General Practice

## 2019-02-14 VITALS — BP 116/81 | HR 82 | Wt 139.0 lb

## 2019-02-14 DIAGNOSIS — Z3A40 40 weeks gestation of pregnancy: Secondary | ICD-10-CM

## 2019-02-14 DIAGNOSIS — Z3493 Encounter for supervision of normal pregnancy, unspecified, third trimester: Secondary | ICD-10-CM

## 2019-02-14 DIAGNOSIS — O480 Post-term pregnancy: Secondary | ICD-10-CM

## 2019-02-14 NOTE — Patient Instructions (Signed)

## 2019-02-14 NOTE — Progress Notes (Signed)
   PRENATAL VISIT NOTE  Subjective:  Lori Wright is a 33 y.o. [phone] at [redacted]w[redacted]d being seen today for ongoing prenatal care.  She is currently monitored for the following issues for this low-risk pregnancy and has Supervision of low-risk pregnancy on their problem list.  Patient reports no complaints.  Contractions: Irritability. Vag. Bleeding: None.  Movement: Present. Denies leaking of fluid.   The following portions of the patient's history were reviewed and updated as appropriate: allergies, current medications, past family history, past medical history, past social history, past surgical history and problem list.   Objective:   Vitals:   02/14/19 0920  BP: 116/81  Pulse: 82  Weight: 139 lb (63 kg)    Fetal Status: Fetal Heart Rate (bpm): 155 Fundal Height: 35 cm Movement: Present  Presentation: Vertex  General:  Alert, oriented and cooperative. Patient is in no acute distress.  Skin: Skin is warm and dry. No rash noted.   Cardiovascular: Normal heart rate noted  Respiratory: Normal respiratory effort, no problems with respiration noted  Abdomen: Soft, gravid, appropriate for gestational age.  Pain/Pressure: Present     Pelvic: Cervical exam deferred        Extremities: Normal range of motion.  Edema: None  Mental Status: Normal mood and affect. Normal behavior. Normal judgment and thought content.   Assessment and Plan:  Pregnancy: [id number] at [redacted]w[redacted]d 1. Encounter for supervision of low-risk pregnancy in third trimester Continue routine prenatal care. Declines IOL until latest jpossible dates, risks reviewed, will need testing--agreeable with this. NST:  Baseline: 130 bpm, Variability: Good {> 6 bpm), Accelerations: Reactive and Decelerations: Absent IOL at 41 4/7 or 41 5/7 days  Term labor symptoms and general obstetric precautions including but not limited to vaginal bleeding, contractions, leaking of fluid and fetal movement were reviewed in detail with the patient.  Please refer to After Visit Summary for other counseling recommendations.   Return in 6 weeks (on 03/28/2019) for pp check needs pap smear, needs BPP/NST this week, and next week with OB visit.  Future Appointments  Date Time Provider [place]  02/14/2019  1:15 PM [facility] NST [facility]    Donnamae Jude, MD

## 2019-02-14 NOTE — Progress Notes (Signed)
NST only today, unable to do BPP due to schedule. Patient will return Wednesday for BPP only- Dr Kennon Rounds agrees.  Koren Bound RN BSN 02/14/19

## 2019-02-15 ENCOUNTER — Telehealth: Payer: Self-pay | Admitting: Family Medicine

## 2019-02-15 ENCOUNTER — Telehealth (HOSPITAL_COMMUNITY): Payer: Self-pay | Admitting: *Deleted

## 2019-02-15 NOTE — Telephone Encounter (Signed)
Preadmission screen  

## 2019-02-15 NOTE — Telephone Encounter (Signed)
Attempted to call patient about her appointment on 8/12 @ 8:15. No answer, left voicemail instructing patient to wear a face mask for the entire appointment and no visitors are allowed. Patient instructed not to attend her appointment if she has any type of symptoms. Symptom list and office number left.

## 2019-02-16 ENCOUNTER — Other Ambulatory Visit: Payer: Self-pay

## 2019-02-16 ENCOUNTER — Ambulatory Visit: Payer: Self-pay

## 2019-02-16 ENCOUNTER — Telehealth (HOSPITAL_COMMUNITY): Payer: Self-pay | Admitting: *Deleted

## 2019-02-16 ENCOUNTER — Ambulatory Visit (INDEPENDENT_AMBULATORY_CARE_PROVIDER_SITE_OTHER): Payer: No Typology Code available for payment source | Admitting: General Practice

## 2019-02-16 DIAGNOSIS — O480 Post-term pregnancy: Secondary | ICD-10-CM

## 2019-02-16 DIAGNOSIS — Z3A40 40 weeks gestation of pregnancy: Secondary | ICD-10-CM

## 2019-02-16 NOTE — Progress Notes (Signed)
Pt informed that the ultrasound is considered a limited OB ultrasound and is not intended to be a complete ultrasound exam.  Patient also informed that the ultrasound is not being completed with the intent of assessing for fetal or placental anomalies or any pelvic abnormalities.  Explained that the purpose of today's ultrasound is to assess for  BPP, presentation and AFI.  Patient acknowledges the purpose of the exam and the limitations of the study.    Koren Bound RN BSN 02/16/19

## 2019-02-16 NOTE — Telephone Encounter (Signed)
Preadmission screen  

## 2019-02-17 ENCOUNTER — Encounter (HOSPITAL_COMMUNITY): Payer: Self-pay | Admitting: *Deleted

## 2019-02-17 ENCOUNTER — Telehealth (HOSPITAL_COMMUNITY): Payer: Self-pay | Admitting: *Deleted

## 2019-02-17 NOTE — Telephone Encounter (Signed)
Preadmission screen  

## 2019-02-18 ENCOUNTER — Other Ambulatory Visit: Payer: Self-pay | Admitting: Advanced Practice Midwife

## 2019-02-20 ENCOUNTER — Encounter (HOSPITAL_COMMUNITY): Payer: Self-pay

## 2019-02-20 ENCOUNTER — Inpatient Hospital Stay (HOSPITAL_COMMUNITY)
Admission: AD | Admit: 2019-02-20 | Discharge: 2019-02-22 | DRG: 807 | Disposition: A | Discharge status: Home / Self Care | Payer: No Typology Code available for payment source | Attending: Internal Medicine | Admitting: Internal Medicine

## 2019-02-20 ENCOUNTER — Other Ambulatory Visit: Payer: Self-pay

## 2019-02-20 DIAGNOSIS — Z3493 Encounter for supervision of normal pregnancy, unspecified, third trimester: Secondary | ICD-10-CM

## 2019-02-20 DIAGNOSIS — Z3A41 41 weeks gestation of pregnancy: Secondary | ICD-10-CM

## 2019-02-20 DIAGNOSIS — Z20828 Contact with and (suspected) exposure to other viral communicable diseases: Secondary | ICD-10-CM | POA: Diagnosis present

## 2019-02-20 DIAGNOSIS — Z87891 Personal history of nicotine dependence: Secondary | ICD-10-CM | POA: Diagnosis not present

## 2019-02-20 DIAGNOSIS — O480 Post-term pregnancy: Secondary | ICD-10-CM | POA: Diagnosis present

## 2019-02-20 LAB — CBC
HCT: 35.2 % — ABNORMAL LOW (ref 36.0–46.0)
Hemoglobin: 11.7 g/dL — ABNORMAL LOW (ref 12.0–15.0)
MCH: 27.9 pg (ref 26.0–34.0)
MCHC: 33.2 g/dL (ref 30.0–36.0)
MCV: 83.8 fL (ref 80.0–100.0)
Platelets: 210 10*3/uL (ref 150–400)
RBC: 4.2 MIL/uL (ref 3.87–5.11)
RDW: 13.2 % (ref 11.5–15.5)
WBC: 8.8 10*3/uL (ref 4.0–10.5)
nRBC: 0 % (ref 0.0–0.2)

## 2019-02-20 LAB — TYPE AND SCREEN
ABO/RH(D): O POS
Antibody Screen: NEGATIVE

## 2019-02-20 MED ORDER — FENTANYL CITRATE (PF) 100 MCG/2ML IJ SOLN
INTRAMUSCULAR | Status: AC
  Administered 2019-02-20: 100 ug via INTRAVENOUS
  Filled 2019-02-20: qty 2

## 2019-02-20 MED ORDER — ACETAMINOPHEN 325 MG PO TABS: 650.0000 mg | ORAL_TABLET | ORAL | Status: DC | PRN

## 2019-02-20 MED ORDER — ONDANSETRON HCL 4 MG/2ML IJ SOLN: 4.0000 mg | Freq: Four times a day (QID) | INTRAMUSCULAR | Status: DC | PRN

## 2019-02-20 MED ORDER — EPHEDRINE 5 MG/ML INJ: 10.0000 mg | INTRAVENOUS | Status: DC | PRN

## 2019-02-20 MED ORDER — PHENYLEPHRINE 40 MCG/ML (10ML) SYRINGE FOR IV PUSH (FOR BLOOD PRESSURE SUPPORT): 80.0000 ug | PREFILLED_SYRINGE | INTRAVENOUS | Status: DC | PRN

## 2019-02-20 MED ORDER — FENTANYL-BUPIVACAINE-NACL 0.5-0.125-0.9 MG/250ML-% EP SOLN: 12.0000 mL/h | EPIDURAL | Status: DC | PRN

## 2019-02-20 MED ORDER — LACTATED RINGERS IV SOLN: INTRAVENOUS | Status: DC

## 2019-02-20 MED ORDER — LACTATED RINGERS IV SOLN: 500.0000 mL | Freq: Once | INTRAVENOUS | Status: DC

## 2019-02-20 MED ORDER — LIDOCAINE HCL (PF) 1 % IJ SOLN: 30.0000 mL | INTRAMUSCULAR | Status: DC | PRN

## 2019-02-20 MED ORDER — OXYCODONE-ACETAMINOPHEN 5-325 MG PO TABS
2.0000 | ORAL_TABLET | ORAL | Status: DC | PRN
  Administered 2019-02-21: 2 via ORAL
  Filled 2019-02-20: qty 2

## 2019-02-20 MED ORDER — FENTANYL CITRATE (PF) 100 MCG/2ML IJ SOLN
100.0000 ug | INTRAMUSCULAR | Status: DC | PRN
  Administered 2019-02-20: 100 ug via INTRAVENOUS
  Filled 2019-02-20: qty 2

## 2019-02-20 MED ORDER — OXYTOCIN BOLUS FROM INFUSION
500.0000 mL | Freq: Once | INTRAVENOUS | Status: AC
  Administered 2019-02-21: 500 mL via INTRAVENOUS

## 2019-02-20 MED ORDER — DIPHENHYDRAMINE HCL 50 MG/ML IJ SOLN: 12.5000 mg | INTRAMUSCULAR | Status: DC | PRN

## 2019-02-20 MED ORDER — SOD CITRATE-CITRIC ACID 500-334 MG/5ML PO SOLN: 30.0000 mL | ORAL | Status: DC | PRN

## 2019-02-20 MED ORDER — OXYTOCIN 40 UNITS IN NORMAL SALINE INFUSION - SIMPLE MED
2.5000 [IU]/h | INTRAVENOUS | Status: DC
  Administered 2019-02-21: 2.5 [IU]/h via INTRAVENOUS
  Filled 2019-02-20: qty 1000

## 2019-02-20 MED ORDER — OXYCODONE-ACETAMINOPHEN 5-325 MG PO TABS: 1.0000 | ORAL_TABLET | ORAL | Status: DC | PRN

## 2019-02-20 MED ORDER — LACTATED RINGERS IV SOLN: 500.0000 mL | INTRAVENOUS | Status: DC | PRN

## 2019-02-20 NOTE — MAU Note (Signed)
Pt reports contractions since 4pm that are less than 5 mins apart. Denies LOF or vaginal bleeding. Reports good fetal movement. Cervix has not been examined. Scheduled for IOL on 02/25/19.
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
## 2019-02-21 ENCOUNTER — Encounter (HOSPITAL_COMMUNITY): Payer: Self-pay | Admitting: Anesthesiology

## 2019-02-21 ENCOUNTER — Other Ambulatory Visit: Payer: Self-pay

## 2019-02-21 ENCOUNTER — Encounter: Payer: Self-pay | Admitting: Obstetrics & Gynecology

## 2019-02-21 DIAGNOSIS — O480 Post-term pregnancy: Secondary | ICD-10-CM

## 2019-02-21 DIAGNOSIS — Z3A41 41 weeks gestation of pregnancy: Secondary | ICD-10-CM

## 2019-02-21 LAB — ABO/RH: ABO/RH(D): O POS

## 2019-02-21 LAB — RPR: RPR Ser Ql: NONREACTIVE

## 2019-02-21 LAB — BIRTH TISSUE RECOVERY COLLECTION (PLACENTA DONATION)

## 2019-02-21 LAB — SARS CORONAVIRUS 2 BY RT PCR (HOSPITAL ORDER, PERFORMED IN ~~LOC~~ HOSPITAL LAB): SARS Coronavirus 2: NEGATIVE

## 2019-02-21 MED ORDER — SENNOSIDES-DOCUSATE SODIUM 8.6-50 MG PO TABS
2.0000 | ORAL_TABLET | ORAL | Status: DC
  Administered 2019-02-22: 2 via ORAL
  Filled 2019-02-21: qty 2

## 2019-02-21 MED ORDER — PRENATAL MULTIVITAMIN CH
1.0000 | ORAL_TABLET | Freq: Every day | ORAL | Status: DC
  Administered 2019-02-21: 1 via ORAL
  Filled 2019-02-21: qty 1

## 2019-02-21 MED ORDER — BENZOCAINE-MENTHOL 20-0.5 % EX AERO: 1.0000 "application " | INHALATION_SPRAY | CUTANEOUS | Status: DC | PRN

## 2019-02-21 MED ORDER — DIPHENHYDRAMINE HCL 25 MG PO CAPS: 25.0000 mg | ORAL_CAPSULE | Freq: Four times a day (QID) | ORAL | Status: DC | PRN

## 2019-02-21 MED ORDER — SIMETHICONE 80 MG PO CHEW: 80.0000 mg | CHEWABLE_TABLET | ORAL | Status: DC | PRN

## 2019-02-21 MED ORDER — WITCH HAZEL-GLYCERIN EX PADS: 1.0000 "application " | MEDICATED_PAD | CUTANEOUS | Status: DC | PRN

## 2019-02-21 MED ORDER — ZOLPIDEM TARTRATE 5 MG PO TABS: 5.0000 mg | ORAL_TABLET | Freq: Every evening | ORAL | Status: DC | PRN

## 2019-02-21 MED ORDER — METHYLERGONOVINE MALEATE 0.2 MG/ML IJ SOLN
INTRAMUSCULAR | Status: AC
  Filled 2019-02-21: qty 1

## 2019-02-21 MED ORDER — COCONUT OIL OIL: 1.0000 "application " | TOPICAL_OIL | Status: DC | PRN

## 2019-02-21 MED ORDER — ONDANSETRON HCL 4 MG PO TABS: 4.0000 mg | ORAL_TABLET | ORAL | Status: DC | PRN

## 2019-02-21 MED ORDER — ACETAMINOPHEN 325 MG PO TABS
650.0000 mg | ORAL_TABLET | ORAL | Status: DC | PRN
  Administered 2019-02-21: 650 mg via ORAL
  Filled 2019-02-21: qty 2

## 2019-02-21 MED ORDER — METHYLERGONOVINE MALEATE 0.2 MG/ML IJ SOLN
0.2000 mg | Freq: Once | INTRAMUSCULAR | Status: AC
  Administered 2019-02-21: 0.2 mg via INTRAMUSCULAR

## 2019-02-21 MED ORDER — IBUPROFEN 600 MG PO TABS
600.0000 mg | ORAL_TABLET | Freq: Four times a day (QID) | ORAL | Status: DC
  Administered 2019-02-21 – 2019-02-22 (×5): 600 mg via ORAL
  Filled 2019-02-21 (×5): qty 1

## 2019-02-21 MED ORDER — TETANUS-DIPHTH-ACELL PERTUSSIS 5-2.5-18.5 LF-MCG/0.5 IM SUSP: 0.5000 mL | Freq: Once | INTRAMUSCULAR | Status: DC

## 2019-02-21 MED ORDER — DIBUCAINE (PERIANAL) 1 % EX OINT: 1.0000 "application " | TOPICAL_OINTMENT | CUTANEOUS | Status: DC | PRN

## 2019-02-21 MED ORDER — ONDANSETRON HCL 4 MG/2ML IJ SOLN: 4.0000 mg | INTRAMUSCULAR | Status: DC | PRN

## 2019-02-21 MED ORDER — MEASLES, MUMPS & RUBELLA VAC IJ SOLR: 0.5000 mL | Freq: Once | INTRAMUSCULAR | Status: DC

## 2019-02-21 NOTE — Discharge Summary (Signed)
OB Discharge Summary     Patient Name: Lori Wright DOB: 01-Apr-1986 MRN: [id number]  Date of admission: 02/20/2019 Delivering MD: Nicolette Bang   Date of discharge: 02/22/2019  Admitting diagnosis: ctx 5 minutes Intrauterine pregnancy: [redacted]w[redacted]d     Secondary diagnosis:  Active Problems:   Indication for care in labor or delivery   Post term pregnancy   SVD (spontaneous vaginal delivery)  Additional problems: N/A     Discharge diagnosis: Term Pregnancy Delivered                                                                                                Post partum procedures:None   Augmentation: None   Complications: None  Hospital course:  Onset of Labor With Vaginal Delivery     33 y.o. yo [id number] at [redacted]w[redacted]d was admitted in Active Labor on 02/20/2019. Patient had an uncomplicated labor course as follows:  Membrane Rupture Time/Date: 12:48 AM ,02/21/2019   Intrapartum Procedures: Episiotomy: None [1]                                         Lacerations:  None [1]  Patient had a delivery of a Viable infant. 02/21/2019  Information for the patient's newborn:  Nashiya, [place] Girl Lori [[id number]]  Delivery Method: Vaginal, Spontaneous(Filed from Delivery Summary)     Pateint had an uncomplicated postpartum course.  She is ambulating, tolerating a regular diet, passing flatus, and urinating well. Patient is discharged home in stable condition on 02/22/19.   Physical exam  Vitals:   02/21/19 0920 02/21/19 1400 02/21/19 2131 02/22/19 0610  BP: 113/68 108/68 107/66 108/78  Pulse: (!) 54 60 (!) 59 66  Resp: 18 16 20 18   Temp: (!) 97.4 F (36.3 C)  98.3 F (36.8 C) 98.1 F (36.7 C)  TempSrc: Oral  Oral Oral  SpO2: 99%  98% 99%  Weight:      Height:       General: alert, cooperative and no distress Lochia: appropriate Uterine Fundus: firm Incision: N/A DVT Evaluation: No evidence of DVT seen on physical exam. Negative Homan's sign. No cords or calf  tenderness. No significant calf/ankle edema. Labs: Lab Results  Component Value Date   WBC 8.8 02/20/2019   HGB 11.7 (L) 02/20/2019   HCT 35.2 (L) 02/20/2019   MCV 83.8 02/20/2019   PLT 210 02/20/2019   No flowsheet data found.  Discharge instruction: per After Visit Summary and "Baby and Me Booklet".  After visit meds:  Allergies as of 02/22/2019   No Known Allergies     Medication List    TAKE these medications   DHA PO Take 2 tablets by mouth daily.   ibuprofen 600 MG tablet Commonly known as: ADVIL Take 1 tablet (600 mg total) by mouth every 6 (six) hours as needed for moderate pain.   prenatal multivitamin Tabs tablet Take 3 tablets by mouth daily at 12 noon.       Diet: routine diet  Activity:  Advance as tolerated. Pelvic rest for 6 weeks.   Outpatient follow up:4 weeks Follow up Appt: Future Appointments  Date Time Provider Department Center  03/30/2019  1:15 PM Reva BoresPratt, Tanya S, MD [facility]   Follow up Visit:No follow-ups on file.   Please schedule this patient for Postpartum visit in: 4 weeks with the following provider: Any provider For C/S patients schedule nurse incision check in weeks 2 weeks: no Low risk pregnancy complicated by: postterm pregnancy  Delivery mode:  SVD Anticipated Birth Control:  vasectomy  PP Procedures needed: None   Schedule Integrated BH visit: no   Postpartum contraception: Vasectomy  Newborn Data: Live born female  Birth Weight:  3099 g  APGAR: 9, 9  Newborn Delivery   Birth date/time: 02/21/2019 00:50:00 Delivery type: Vaginal, Spontaneous      Baby Feeding: Breast Disposition:home with mother   02/22/2019 Sharyon CableVeronica C Austynn Pridmore, CNM

## 2019-02-21 NOTE — Lactation Note (Signed)
This note was copied from a baby's chart. Lactation Consultation Note  Patient Name: Girl [place] Haven HTXHF'S Date: 02/21/2019   Per RN Yetta Flock, mom does not wish to see lactation. Baby is BF well so far and she'll call PRN only if needed.  Maternal Data    Feeding Feeding Type: Breast Fed  LATCH Score                   Interventions    Lactation Tools Discussed/Used     Consult Status      Evo Aderman Francene Boyers 02/21/2019, 3:45 PM
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
## 2019-02-22 ENCOUNTER — Encounter: Payer: No Typology Code available for payment source | Admitting: Family Medicine

## 2019-02-22 MED ORDER — IBUPROFEN 600 MG PO TABS: 600.0000 mg | ORAL_TABLET | Freq: Four times a day (QID) | ORAL | 0 refills | Status: DC | PRN

## 2019-02-23 ENCOUNTER — Other Ambulatory Visit (HOSPITAL_COMMUNITY)
Admission: RE | Admit: 2019-02-23 | Discharge: 2019-02-23 | Disposition: A | Discharge status: Home / Self Care | Payer: No Typology Code available for payment source | Source: Ambulatory Visit

## 2019-02-25 ENCOUNTER — Inpatient Hospital Stay (HOSPITAL_COMMUNITY): Payer: No Typology Code available for payment source

## 2019-02-25 ENCOUNTER — Inpatient Hospital Stay (HOSPITAL_COMMUNITY)
Admission: AD | Admit: 2019-02-25 | Payer: No Typology Code available for payment source | Source: Home / Self Care | Admitting: Family Medicine

## 2019-03-30 ENCOUNTER — Encounter: Payer: Self-pay | Admitting: Family Medicine

## 2019-03-30 ENCOUNTER — Ambulatory Visit (INDEPENDENT_AMBULATORY_CARE_PROVIDER_SITE_OTHER): Payer: No Typology Code available for payment source | Admitting: Family Medicine

## 2019-03-30 ENCOUNTER — Other Ambulatory Visit: Payer: Self-pay

## 2019-03-30 DIAGNOSIS — Z1151 Encounter for screening for human papillomavirus (HPV): Secondary | ICD-10-CM

## 2019-03-30 DIAGNOSIS — Z124 Encounter for screening for malignant neoplasm of cervix: Secondary | ICD-10-CM | POA: Diagnosis not present

## 2019-03-30 NOTE — Progress Notes (Signed)
Subjective:     Lori Wright is a 33 y.o. female who presents for a postpartum visit. She is 4 weeks postpartum following a SVD. I have fully reviewed the prenatal and intrapartum course. The delivery was at [redacted]w[redacted]d gestational weeks. Outcome: spontaneous vaginal delivery. Anesthesia: none. Postpartum course has been unremarkable. Baby's course has been Unremarkable. Baby is feeding by breast. Bleeding no bleeding. Bowel function is normal. Bladder function is normal. Patient is not sexually active. Contraception method is vasectomy. Postpartum depression screening: Negative I reviewed above and independently reviewed it.  The following portions of the patient's history were reviewed and updated as appropriate: allergies, current medications, past family history, past medical history, past social history, past surgical history and problem list.  Review of Systems Pertinent items noted in HPI and remainder of comprehensive ROS otherwise negative.   Objective:    BP (!) 143/97   Pulse 68   Wt 121 lb 1.6 oz (54.9 kg)   BMI 23.65 kg/m   General:  alert, cooperative and appears stated age  Lungs: normal effort  Heart:  regular rate and rhythm  Abdomen: soft, non-tender; bowel sounds normal; no masses,  no organomegaly   Vulva:  normal  Vagina: normal vagina  Cervix:  multiparous appearance        Assessment:    Nml postpartum exam. Pap smear 03/30/2019 at today's visit.   Plan:    1. Contraception: condoms and vasectomy 2.  Pap done 3. Follow up in: 1 year or as needed.

## 2019-04-07 LAB — CYTOLOGY - PAP
Diagnosis: NEGATIVE
High risk HPV: NEGATIVE
Molecular Disclaimer: 56
Molecular Disclaimer: NORMAL

## 2023-10-29 ENCOUNTER — Other Ambulatory Visit: Payer: Self-pay

## 2023-10-29 ENCOUNTER — Emergency Department (HOSPITAL_COMMUNITY)

## 2023-10-29 ENCOUNTER — Emergency Department (HOSPITAL_COMMUNITY)
Admission: EM | Admit: 2023-10-29 | Discharge: 2023-10-29 | Disposition: A | Discharge status: Home / Self Care | Attending: Emergency Medicine | Admitting: Emergency Medicine

## 2023-10-29 ENCOUNTER — Encounter (HOSPITAL_COMMUNITY): Payer: Self-pay

## 2023-10-29 DIAGNOSIS — Y92410 Unspecified street and highway as the place of occurrence of the external cause: Secondary | ICD-10-CM | POA: Insufficient documentation

## 2023-10-29 DIAGNOSIS — R519 Headache, unspecified: Secondary | ICD-10-CM | POA: Insufficient documentation

## 2023-10-29 DIAGNOSIS — M542 Cervicalgia: Secondary | ICD-10-CM | POA: Diagnosis present

## 2023-10-29 DIAGNOSIS — M25512 Pain in left shoulder: Secondary | ICD-10-CM | POA: Insufficient documentation

## 2023-10-29 MED ORDER — METHOCARBAMOL 500 MG PO TABS
750.0000 mg | ORAL_TABLET | Freq: Once | ORAL | Status: AC
  Administered 2023-10-29: 750 mg via ORAL
  Filled 2023-10-29: qty 2

## 2023-10-29 MED ORDER — IBUPROFEN 400 MG PO TABS
600.0000 mg | ORAL_TABLET | Freq: Once | ORAL | Status: DC
  Filled 2023-10-29: qty 1

## 2023-10-29 MED ORDER — METHOCARBAMOL 750 MG PO TABS: 750.0000 mg | ORAL_TABLET | Freq: Three times a day (TID) | ORAL | 0 refills | Status: AC | PRN

## 2023-10-29 MED ORDER — ACETAMINOPHEN 500 MG PO TABS
1000.0000 mg | ORAL_TABLET | Freq: Once | ORAL | Status: AC
  Administered 2023-10-29: 1000 mg via ORAL
  Filled 2023-10-29: qty 2
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
## 2024-04-25 NOTE — Progress Notes (Deleted)
    OFFICE NOTE:    Date:  04/25/2024  ID:  Lori Wright, DOB [birth year], MRN [id number] PCP: Burney Darice CROME, MD  [facility] Providers Cardiologist:  None { Click to update primary MD,subspecialty MD or APP then REFRESH:1}       Hypertension  Anxiety, depression, PTSD FHx of heart disease        Discussed the use of AI scribe software for clinical note transcription with the patient, who gave verbal consent to proceed.  History of Present Illness Lori Wright is a 38 y.o. female who is referred by Burney Darice CROME, MD for evaluation of palpitations and chest pain.     ROS-See HPI***    Studies Reviewed:      Labs from primary care personally reviewed and interpreted 03/25/2024: Creatinine 0.51, BUN 5, K 4.1, albumin 4.9, ALT 21, TSH 0.38, free T41.2, Hgb 13.9, PLT 318K, 04/08/2024: Total cholesterol 192, HDL 41, triglycerides 127, LDL 127 Results  Risk Assessment/Calculations: {Does this patient have ATRIAL FIBRILLATION?:[phone]} No BP recorded.  {Refresh Note OR Click here to enter BP  :1}***      Physical Exam:  VS:  There were no vitals taken for this visit.       Wt Readings from Last 3 Encounters:  10/29/23 121 lb (54.9 kg)  03/30/19 121 lb 1.6 oz (54.9 kg)  02/20/19 138 lb 14.4 oz (63 kg)    Physical Exam***     Assessment and Plan:    Assessment & Plan Precordial chest pain  Palpitations  Assessment and Plan Assessment & Plan    {      :1}    {Are you ordering a CV Procedure (e.g. stress test, cath, DCCV, TEE, etc)?   Press F2        :[id number]}  Dispo:  No follow-ups on file.  Signed, Glendia Ferrier, PA-C

## 2024-04-26 ENCOUNTER — Ambulatory Visit: Admitting: Physician Assistant

## 2024-04-26 DIAGNOSIS — R072 Precordial pain: Secondary | ICD-10-CM

## 2024-04-26 DIAGNOSIS — R002 Palpitations: Secondary | ICD-10-CM

## 2024-04-28 NOTE — Progress Notes (Signed)
 Cardiology Office Note    Date:  04/30/2024  ID:  Lori Wright, DOB 12-Feb-1986, MRN [id number] PCP:  Burney Darice CROME, MD  Cardiologist:  None  Electrophysiologist:  None   Chief Complaint: Chest tightness, palpitations   History of Present Illness: .   Lori Wright is a 38 y.o. female with visit-pertinent history of hypertension, palpitations, bipolar II disorder. Patient presents for [facility] for new patient evaluation.   Today she presents with concerns for palpitations and chest tightness.  Patient reports that starting two months ago she started having increased episodes of chest tightness, notes she does have some history of chest tightness related to increased anxiety however feels that this has become more persistent in recent months.  She also reports increased palpitations or feeling as though her heart is pounding, notes on occasion at rest her heart rate would spike up to 140 bpm on her Apple watch, will also have increased palpitations with exertion as well as feelings of chest tightness, typically resolve with rest and deep breathing.  She notes that sometimes when she has increased palpitations that she will take Klonopin and alprazolam with improvement in symptoms, she is unsure if this is significant anxiety.  She reports that her episodes of palpitations typically occur at least 3 times a week.  She reports that she previously was regularly walking her dog however is unable to as a result of palpitations and chest tightness, also notes some associated shortness of breath.  She reports that she also has not been sleeping well in recent months, denies any significant snoring, orthopnea or PND.  She also notes concerns regarding her blood pressure, notes that a little over a month ago she had COVID and started on cold and flu medications resulting in blood pressure of 160/100, noted some improvement with discontinuation of cold medications, although monitoring her  blood pressure at home caused increased anxiety.  Patient notes that she does have a history of alcohol, tobacco and drug use.  She stopped smoking a few years ago however notes that she smoked from around age [age], stopped drug use for at least the last 10 years and stopped alcohol intake 4 years ago. Patient reports significant family history of heart disease with mother passing at age 60 from heart condition, she is unaware of condition type, notes she was on multiple cardiac medications. Her maternal grandmother passing at age 32 due to unknown heart condition.  ROS: .   Today she denies lower extremity edema, fatigue, melena, hematuria, hemoptysis, diaphoresis, weakness, syncope, orthopnea, and PND.  All other systems are reviewed and otherwise negative. Studies Reviewed: SABRA   EKG:  EKG is ordered today, personally reviewed, demonstrating  EKG Interpretation Date/Time:  Friday April 29 2024 09:14:01 EDT Ventricular Rate:  92 PR Interval:  148 QRS Duration:  74 QT Interval:  346 QTC Calculation: 427 R Axis:   46  Text Interpretation: Normal sinus rhythm Normal ECG No previous ECGs available Confirmed by Mariena Meares [phone]) on 04/29/2024 6:37:39 PM   CV Studies: Cardiac studies reviewed are outlined and summarized above. Otherwise please see EMR for full report.      Current Reported Medications:.    Current Meds  Medication Sig   albuterol (VENTOLIN HFA) 108 (90 Base) MCG/ACT inhaler Inhale 2 puffs into the lungs.   clonazePAM (KLONOPIN) 0.5 MG tablet Take 0.5 mg by mouth 2 (two) times daily as needed for anxiety.   Docosahexaenoic Acid (DHA PO) Take 2 tablets  by mouth daily.   hydrOXYzine (ATARAX) 25 MG tablet Take 25 mg by mouth 3 (three) times daily as needed.   hydrOXYzine (VISTARIL) 25 MG capsule Take 25-50 mg by mouth every 6 (six) hours as needed.   lamoTRIgine (LAMICTAL) 100 MG tablet Take 100 mg by mouth daily.   methocarbamol  (ROBAXIN -750) 750 MG tablet Take 1 tablet  (750 mg total) by mouth every 8 (eight) hours as needed for up to 15 doses for muscle spasms.   pramipexole (MIRAPEX) 0.5 MG tablet Take by mouth.   Prenatal Vit-Fe Fumarate-FA (PRENATAL MULTIVITAMIN) TABS tablet Take 3 tablets by mouth daily at 12 noon.   propranolol (INDERAL) 20 MG tablet TAKE 1-2 TAB EVERY 6 HOURS AS NEEDED   [DISCONTINUED] metoprolol tartrate (LOPRESSOR) 100 MG tablet Take 1 tablet (100 mg total) by mouth once for 1 dose. TAKE TWO HOURS PRIOR TO PROCEDURE    Physical Exam:    VS:  BP 126/78   Pulse 90   Ht 5' 1 (1.549 m)   Wt 143 lb (64.9 kg)   SpO2 97%   BMI 27.02 kg/m    Wt Readings from Last 3 Encounters:  04/29/24 143 lb (64.9 kg)  10/29/23 121 lb (54.9 kg)  03/30/19 121 lb 1.6 oz (54.9 kg)    GEN: Well nourished, well developed in no acute distress NECK: No JVD; No carotid bruits CARDIAC: RRR, no murmurs, rubs, gallops RESPIRATORY:  Clear to auscultation without rales, wheezing or rhonchi  ABDOMEN: Soft, non-tender, non-distended EXTREMITIES:  No edema; No acute deformity     Asessement and Plan:.    Palpitations: Patient reports that in recent weeks she has had significant feelings of increased heart rates and pounding heartbeats.  She notes that this can occur at rest and with exertion, has noted some associated chest tightness and increasing shortness of breath.  She denies any syncope.  She notes that some this may be related to increased anxiety, notes that on some occasions her palpitations are significantly improved with her antianxiety medications, although also notes worsened palpitations on exertion.  She is agreeable to wearing 2-week cardiac monitor for further evaluation.  Will also check echocardiogram as noted below.  Reviewed ED precautions.  Patient reports that her PCP recently checked lab work, will request from PCPs office.  Precordial pain: Patient notes some history of chest tightness related to increased anxiety.  However notes in  recent weeks she has had increased chest tightness and shortness of breath with activity such as walking her dog, typically relieved with rest.  Given exertional symptoms and significant family history we will proceed with coronary CTA, she will take metoprolol tartrate 100 mg prior to testing, reviewed indication for nitroglycerin use with testing.  Check BMET and HcG. Will also check echocardiogram.  Reviewed ED precautions.  HTN: Initial blood pressure today 144/92, on recheck was 126/78.  Patient noted concern as with taking of cold medications her blood pressure significantly increased to 160/100.  She became increasingly anxious checking her blood pressure at home and stopped checking.  Discussed with patient that increased stress and anxiety can result in increased blood pressures.  She will monitor her blood pressure at home if she is comfortable to do so, notes will also be reevaluated at other doctors visits.   Disposition: F/u with Donshay Lupinski, NP in 6-8 weeks.   Signed, [place] D Jamison Soward, NP

## 2024-04-29 ENCOUNTER — Other Ambulatory Visit: Payer: Self-pay | Admitting: Cardiology

## 2024-04-29 ENCOUNTER — Ambulatory Visit: Attending: Cardiology | Admitting: Cardiology

## 2024-04-29 ENCOUNTER — Ambulatory Visit

## 2024-04-29 ENCOUNTER — Encounter: Payer: Self-pay | Admitting: Cardiology

## 2024-04-29 VITALS — BP 126/78 | HR 90 | Ht 61.0 in | Wt 143.0 lb

## 2024-04-29 DIAGNOSIS — R002 Palpitations: Secondary | ICD-10-CM

## 2024-04-29 DIAGNOSIS — R079 Chest pain, unspecified: Secondary | ICD-10-CM

## 2024-04-29 DIAGNOSIS — I10 Essential (primary) hypertension: Secondary | ICD-10-CM | POA: Diagnosis not present

## 2024-04-29 MED ORDER — METOPROLOL TARTRATE 100 MG PO TABS: 100.0000 mg | ORAL_TABLET | Freq: Once | ORAL | 3 refills | Status: DC

## 2024-04-29 MED ORDER — METOPROLOL TARTRATE 100 MG PO TABS
100.0000 mg | ORAL_TABLET | Freq: Once | ORAL | 0 refills | Status: AC
Start: 2024-04-29 — End: 2024-04-29

## 2024-04-29 NOTE — Patient Instructions (Signed)
 Medication Instructions:   ONE DOSE ONLY: TAKE METOPROLOL  100 MG  2 HOUR PRIOR TO SCHEDULED PROCEDURE   *If you need a refill on your cardiac medications before your next appointment, please call your pharmacy*   Lab Work:   PLEASE GO DOWN STAIRS  [facility]  FIRST FLOOR   ( GET OFF ELEVATORS WALK TOWARDS WAITING AREA LAB LOCATED BY PHARMACY):  BMET TODAY     If you have labs (blood work) drawn today and your tests are completely normal, you will receive your results only by: MyChart Message (if you have MyChart) OR A paper copy in the mail If you have any lab test that is abnormal or we need to change your treatment, we will call you to review the results.  Testing/Procedures: Your physician has requested that you have an echocardiogram. Echocardiography is a painless test that uses sound waves to create images of your heart. It provides your doctor with information about the size and shape of your heart and how well your heart's chambers and valves are working. This procedure takes approximately one hour. There are no restrictions for this procedure. Please do NOT wear cologne, perfume, aftershave, or lotions (deodorant is allowed). Please arrive 15 minutes prior to your appointment time.  Please note: We ask at that you not bring children with you during ultrasound (echo/ vascular) testing. Due to room size and safety concerns, children are not allowed in the ultrasound rooms during exams. Our front office staff cannot provide observation of children in our lobby area while testing is being conducted. An adult accompanying a patient to their appointment will only be allowed in the ultrasound room at the discretion of the ultrasound technician under special circumstances. We apologize for any inconvenience.  Non-Cardiac CT Angiography (CTA), is a special type of CT scan that uses a computer to produce multi-dimensional views of major blood vessels throughout the body. In CT angiography, a  contrast material is injected through an IV to he  Your physician has recommended that you wear an event monitor. Event monitors are medical devices that record the heart's electrical activity. Doctors most often us  these monitors to diagnose arrhythmias. Arrhythmias are problems with the speed or rhythm of the heartbeat. The monitor is a small, portable device. You can wear one while you do your normal daily activities. This is usually used to diagnose what is causing palpitations/syncope (passing out).    Follow-Up: At [facility], you and your health needs are our priority.  As part of our continuing mission to provide you with exceptional heart care, our providers are all part of one team.  This team includes your primary Cardiologist (physician) and Advanced Practice Providers or APPs (Physician Assistants and Nurse Practitioners) who all work together to provide you with the care you need, when you need it.  Your next appointment:   6 -8 week(s)    Provider:   Kaitlyn West    We recommend signing up for the patient portal called MyChart.  Sign up information is provided on this After Visit Summary.  MyChart is used to connect with patients for Virtual Visits (Telemedicine).  Patients are able to view lab/test results, encounter notes, upcoming appointments, etc.  Non-urgent messages can be sent to your provider as well.   To learn more about what you can do with MyChart, go to [email or web address].   Other Instructions    Your cardiac CT will be scheduled at one of the below locations:  [facility] [address] [place], [place] [address] [phone] (Severe contrast allergies only)  OR   [facility] [address] [place], [place] [address] [phone]  OR   [facility] [address] [place], [place] [address] [phone]  OR   [facility] and Vascular Tower [address]   [place], [place] [address]  OR   [facility] [address] [place], [place] [phone]  If scheduled at [facility], please arrive at the [facility] and Children's Entrance (Entrance C2) of [facility] 30 minutes prior to test start time. You can use the FREE valet parking offered at entrance C (encouraged to control the heart rate for the test)  Proceed to the [facility] Radiology Department (first floor) to check-in and test prep.  All radiology patients and guests should use entrance C2 at [facility], accessed from [facility], even though the hospital's physical address listed is [address]  If scheduled at the Heart and Vascular Tower at [address], please enter the parking lot using the [address] entrance and use the FREE valet service at the patient drop-off area. Enter the building and check-in with registration on the main floor.  If scheduled at [facility], please arrive to the Heart and Vascular Center 15 mins early for check-in and test prep.  There is spacious parking and easy access to the radiology department from the [facility] V[facility] entrance. Please enter here and check-in with the desk attendant.   If scheduled at [facility], please arrive 30 minutes early for check-in and test prep.  Please follow these instructions carefully (unless otherwise directed):  An IV will be required for this test and Nitroglycerin will be given.    On the Night Before the Test: Be sure to Drink plenty of water. Do not consume any caffeinated/decaffeinated beverages or chocolate 12 hours prior to your test. Do not take any antihistamines 12 hours prior to your test.   On the Day of the Test: Drink plenty of water until 1 hour prior to the test. Do not eat any food 1 hour prior to test. You may take your regular medications prior to the test.  Take metoprolol (Lopressor) two hours  prior to test. If you take Furosemide/Hydrochlorothiazide/Spironolactone/Chlorthalidone, please HOLD on the morning of the test. Patients who wear a continuous glucose monitor MUST remove the device prior to scanning. FEMALES- please wear underwire-free bra if available, avoid dresses & tight clothing       After the Test: Drink plenty of water. After receiving IV contrast, you may experience a mild flushed feeling. This is normal. On occasion, you may experience a mild rash up to 24 hours after the test. This is not dangerous. If this occurs, you can take Benadryl  25 mg, Zyrtec, Claritin, or Allegra and increase your fluid intake. (Patients taking Tikosyn should avoid Benadryl , and may take Zyrtec, Claritin, or Allegra) If you experience trouble breathing, this can be serious. If it is severe call 911 IMMEDIATELY. If it is mild, please call our office.  We will call to schedule your test 2-4 weeks out understanding that some insurance companies will need an authorization prior to the service being performed.   For more information and frequently asked questions, please visit our website : [email or web address]  For non-scheduling related questions, please contact the cardiac imaging nurse navigator should you have any questions/concerns:  Cardiac Imaging Nurse Navigators Direct Office Dial: [phone]   For scheduling needs, including cancellations and rescheduling, please call [place], [phone].   ZIO XT- Long Term Monitor Instructions  Your physician has requested you wear a ZIO patch monitor for 14 days.  This is a single patch monitor. [employer] supplies one patch monitor per enrollment. Additional stickers are not available. Please do not apply patch if you will be having a Nuclear Stress Test,  Echocardiogram, Cardiac CT, MRI, or Chest Xray during the period you would be wearing the  monitor. The patch cannot be worn during these tests. You cannot remove and  re-apply the  ZIO XT patch monitor.  Your ZIO patch monitor will be mailed 3 day USPS to your address on file. It may take 3-5 days  to receive your monitor after you have been enrolled.  Once you have received your monitor, please review the enclosed instructions. Your monitor  has already been registered assigning a specific monitor serial # to you.  Billing and Patient Assistance Program Information  We have supplied [employer] with any of your insurance information on file for billing purposes. [employer] offers a sliding scale Patient Assistance Program for patients that do not have  insurance, or whose insurance does not completely cover the cost of the ZIO monitor.  You must apply for the Patient Assistance Program to qualify for this discounted rate.  To apply, please call [employer] at [phone], select option 4, select option 2, ask to apply for  Patient Assistance Program. Meredeth will ask your household income, and how many people  are in your household. They will quote your out-of-pocket cost based on that information.  [employer] will also be able to set up a 1-month, interest-free payment plan if needed.  Applying the monitor   Shave hair from upper left chest.  Hold abrader disc by orange tab. Rub abrader in 40 strokes over the upper left chest as  indicated in your monitor instructions.  Clean area with 4 enclosed alcohol pads. Let dry.  Apply patch as indicated in monitor instructions. Patch will be placed under collarbone on left  side of chest with arrow pointing upward.  Rub patch adhesive wings for 2 minutes. Remove white label marked 1. Remove the white  label marked 2. Rub patch adhesive wings for 2 additional minutes.  While looking in a mirror, press and release button in center of patch. A small green light will  flash 3-4 times. This will be your only indicator that the monitor has been turned on.  Do not shower for the first 24 hours. You may shower after the  first 24 hours.  Press the button if you feel a symptom. You will hear a small click. Record Date, Time and  Symptom in the Patient Logbook.  When you are ready to remove the patch, follow instructions on the last 2 pages of Patient  Logbook. Stick patch monitor onto the last page of Patient Logbook.  Place Patient Logbook in the blue and white box. Use locking tab on box and tape box closed  securely. The blue and white box has prepaid postage on it. Please place it in the mailbox as  soon as possible. Your physician should have your test results approximately 7 days after the  monitor has been mailed back to [facility].  Call [facility] Customer Care at [phone] if you have questions regarding  your ZIO XT patch monitor. Call them immediately if you see an orange light blinking  on your  monitor.  If your monitor falls off in less than 4 days, contact our Monitor department at [phone].  If your monitor becomes loose or falls off after 4 days call [employer] at [phone] for  suggestions on securing your monitor

## 2024-04-29 NOTE — Progress Notes (Unsigned)
 Enrolled patient for a 14 day Zio XT  monitor to be mailed to patients home

## 2024-04-30 ENCOUNTER — Encounter: Payer: Self-pay | Admitting: Cardiology

## 2024-05-02 ENCOUNTER — Other Ambulatory Visit: Payer: Self-pay

## 2024-05-02 ENCOUNTER — Telehealth: Payer: Self-pay

## 2024-05-02 DIAGNOSIS — Z01818 Encounter for other preprocedural examination: Secondary | ICD-10-CM

## 2024-05-02 NOTE — Telephone Encounter (Signed)
 Spoke with pt. Pt was seen in office Friday 04/29/24. A new lab order was placed this morning for pt. Pt is aware that when she has labs completed, [facility] will have the new lab order. Pt voiced understanding.

## 2024-05-11 ENCOUNTER — Ambulatory Visit (HOSPITAL_COMMUNITY)

## 2024-06-17 ENCOUNTER — Ambulatory Visit (HOSPITAL_COMMUNITY)

## 2024-06-22 ENCOUNTER — Ambulatory Visit: Admitting: Cardiology
